# Patient Record
Sex: Female | Born: 1995 | Hispanic: No | Smoking: Current every day smoker
Health system: Southern US, Community
[De-identification: ages and names within clinical notes are randomized; demographics above are authoritative.]

## PROBLEM LIST (undated history)

## (undated) DIAGNOSIS — F419 Anxiety disorder, unspecified: Secondary | ICD-10-CM

## (undated) DIAGNOSIS — U071 COVID-19: Secondary | ICD-10-CM

## (undated) HISTORY — PX: NO PAST SURGERIES: SHX2092

## (undated) HISTORY — DX: COVID-19: U07.1

---

## 2018-12-27 DIAGNOSIS — F32A Depression, unspecified: Secondary | ICD-10-CM

## 2018-12-27 HISTORY — DX: Depression, unspecified: F32.A

## 2019-12-28 NOTE — L&D Delivery Note (Addendum)
Delivery Note Pt w/ant lip/+1/urge to push.  FHR Down to 60's w/slow return despite position changes. Lip reduced, pt effective pusher. Dr. Mayford Knife asked to come for vacuum. Risks/benefits of VAD discussed w/pt and partner--verbal consent obtained.  Indication for Vacuum: repetitive late decelerations, NRFHT.  NICU team called to attend delivery. Kiwi placed, 12 pulls with 12 pushes, no pop offs. Max pressure . Placed +1 station, OP presentation.  After a 10 minute 2nd stage, at 12:16 AM a viable female was delivered via Vaginal, Vacuum (Extractor) (Presentation: Left Occiput Anterior).  APGAR: 8/9 , ; weight pending.  After 1 minute, the cord was clamped and cut. Pitocin d/c'd per Dr. Vilinda Blanks request, repair of laceration done w/o problems. The placenta separated spontaneously and delivered via CCT and maternal pushing effort.  It was inspected and appears to be intact with a 3 VC. 40 units of pitocin diluted in 1000cc LR was infused rapidly IV.   Anesthesia: Epidural Episiotomy: None Lacerations: 2nd degree Suture Repair: 3.0 vicryl Est. Blood Loss (mL):  150  Mom to postpartum.  Baby to Couplet care / Skin to Skin.  Lynn Moses 12/19/2020, 12:26 AM

## 2020-01-28 DIAGNOSIS — N83209 Unspecified ovarian cyst, unspecified side: Secondary | ICD-10-CM

## 2020-01-28 HISTORY — DX: Unspecified ovarian cyst, unspecified side: N83.209

## 2020-05-13 DIAGNOSIS — F418 Other specified anxiety disorders: Secondary | ICD-10-CM

## 2020-05-13 HISTORY — DX: Other specified anxiety disorders: F41.8

## 2020-06-04 ENCOUNTER — Telehealth: Payer: Self-pay | Admitting: Family Medicine

## 2020-06-04 NOTE — Telephone Encounter (Signed)
Recv'd records from [facility] forwarded 25 pages to [facility]/Jacob DeCordova 6/9/21fbg

## 2020-06-12 ENCOUNTER — Ambulatory Visit (INDEPENDENT_AMBULATORY_CARE_PROVIDER_SITE_OTHER): Payer: No Typology Code available for payment source | Admitting: Family Medicine

## 2020-06-12 ENCOUNTER — Other Ambulatory Visit: Payer: Self-pay

## 2020-06-12 ENCOUNTER — Encounter: Payer: Self-pay | Admitting: Family Medicine

## 2020-06-12 DIAGNOSIS — Z3480 Encounter for supervision of other normal pregnancy, unspecified trimester: Secondary | ICD-10-CM

## 2020-06-12 DIAGNOSIS — Z8659 Personal history of other mental and behavioral disorders: Secondary | ICD-10-CM

## 2020-06-12 DIAGNOSIS — Z3481 Encounter for supervision of other normal pregnancy, first trimester: Secondary | ICD-10-CM

## 2020-06-12 DIAGNOSIS — Z3A13 13 weeks gestation of pregnancy: Secondary | ICD-10-CM

## 2020-06-12 NOTE — Progress Notes (Signed)
Subjective:  Lynn Moses is a G1P0 [redacted]w[redacted]d being seen today for her first obstetrical visit.  Her obstetrical history is significant for first pregnancy. This is a desired, but unplanned pregnancy. Patient does intend to breast feed. Pregnancy history fully reviewed.  Patient reports no complaints.  BP 101/65   Pulse 69   Ht 5\' 6"  (1.676 m)   Wt 139 lb (63 kg)   LMP 03/13/2020 (Exact Date)   BMI 22.44 kg/m   HISTORY: OB History  Gravida Para Term Preterm AB Living  1            SAB TAB Ectopic Multiple Live Births               # Outcome Date GA Lbr Len/2nd Weight Sex Delivery Anes PTL Lv  1 Current             Past Medical History:  Diagnosis Date  . Depression 2020   pt has stopped taking her Zoloft when she was trying to get pregnant    Past Surgical History:  Procedure Laterality Date  . NO PAST SURGERIES      Family History  Problem Relation Age of Onset  . Hypertension Maternal Grandmother   . Cancer Paternal Grandmother      Exam  BP 101/65   Pulse 69   Ht 5\' 6"  (1.676 m)   Wt 139 lb (63 kg)   LMP 03/13/2020 (Exact Date)   BMI 22.44 kg/m   Chaperone present during exam  CONSTITUTIONAL: Well-developed, well-nourished female in no acute distress.  HENT:  Normocephalic, atraumatic, External right and left ear normal. Oropharynx is clear and moist EYES: Conjunctivae and EOM are normal. Pupils are equal, round, and reactive to light. No scleral icterus.  NECK: Normal range of motion, supple, no masses.  Normal thyroid.  CARDIOVASCULAR: Normal heart rate noted, regular rhythm RESPIRATORY: Clear to auscultation bilaterally. Effort and breath sounds normal, no problems with respiration noted. BREASTS: Symmetric in size. No masses, skin changes, nipple drainage, or lymphadenopathy. ABDOMEN: Soft, normal bowel sounds, no distention noted.  No tenderness, rebound or guarding.  PELVIC: Normal appearing external genitalia; normal appearing vaginal mucosa and  cervix. No abnormal discharge noted. Normal uterine size, no other palpable masses, no uterine or adnexal tenderness. MUSCULOSKELETAL: Normal range of motion. No tenderness.  No cyanosis, clubbing, or edema.  2+ distal pulses. SKIN: Skin is warm and dry. No rash noted. Not diaphoretic. No erythema. No pallor. NEUROLOGIC: Alert and oriented to person, place, and time. Normal reflexes, muscle tone coordination. No cranial nerve deficit noted. PSYCHIATRIC: Normal mood and affect. Normal behavior. Normal judgment and thought content.    Assessment:    Pregnancy: G1P0 Patient Active Problem List   Diagnosis Date Noted  . Supervision of other normal pregnancy, antepartum 06/12/2020  . History of depression 06/12/2020      Plan:   1. Supervision of other normal pregnancy, antepartum FHT and FH normal. Reviewed PNL Desires Panorama. - Babyscripts Schedule Optimization - 06/14/2020 MFM OB COMP + 14 WK; Future - Genetic Screening  2. History of depression She stopped zoloft to get pregnant. She currently feels okay off antidepressant. Would like to meet with 06/14/2020 - patient referred. Discussed outcomes improved with psychosocial development of babies when mother's depression controlled on medication. - Ambulatory referral to Integrated Behavioral Health    Problem list reviewed and updated. 75% of 30 min visit spent on counseling and coordination of care.     [place]  06/12/2020 

## 2020-06-12 NOTE — Progress Notes (Signed)
Patient states she moved from [place] in May 2021. Patient states she did have some bleeding at the beginning of her pregnancy and was put on pelvic rest. Patient has had her first prenatal visit at [facility]. Patient is Conservation officer, nature at [employer]. Patient states that she stopped taking her Zoloft while trying to get pregnant.   Armandina Stammer RN

## 2020-06-17 NOTE — BH Specialist Note (Deleted)
Integrated Behavioral Health via Telemedicine Video (Caregility) Visit  06/17/2020 Clarabel Marion [id number]  Number of Integrated Behavioral Health visits: 1 Session Start time: 1:15***  Session End time: 2:15*** Total time: {IBH Total GBTD:17616073}  Referring Provider: Candelaria Celeste, DO Type of Visit: Video Patient/Family location: Home [facility] Provider location: [facility]  All persons participating in visit: Patient *** and [facility] Pualani Borah ***   Confirmed patient's address: Yes  Confirmed patient's phone number: Yes  Any changes to demographics: No   Confirmed patient's insurance: Yes  Any changes to patient's insurance: No   Discussed confidentiality: Yes ***  I connected with Loree Fee a video enabled telemedicine application (Caregility) and verified that I am speaking with the correct person using two identifiers.     I discussed the limitations of evaluation and management by telemedicine and the availability of in person appointments.  I discussed that the purpose of this visit is to provide behavioral health care while limiting exposure to the novel coronavirus.   Discussed there is a possibility of technology failure and discussed alternative modes of communication if that failure occurs.  I discussed that engaging in this virtual visit, they consent to the provision of behavioral healthcare and the services will be billed under their insurance.  Patient and/or legal guardian expressed understanding and consented to virtual visit: Yes   PRESENTING CONCERNS: Patient and/or family reports the following symptoms/concerns: *** Duration of problem: ***; Severity of problem: {Mild/Moderate/Severe:20260}  STRENGTHS (Protective Factors/Coping Skills): ***  GOALS ADDRESSED: Patient will: 1.  Reduce symptoms of: {IBH Symptoms:21014056}  2.  Increase knowledge and/or ability of: {IBH Patient Tools:21014057}   3.  Demonstrate ability to: {IBH Goals:21014053}  INTERVENTIONS: Interventions utilized:  {IBH Interventions:21014054} Standardized Assessments completed: {IBH Screening Tools:21014051}  ASSESSMENT: Patient currently experiencing ***.   Patient may benefit from psychoeducation and brief therapeutic interventions regarding coping with symptoms of *** .  PLAN: 1. Follow up with behavioral health clinician on : *** 2. Behavioral recommendations:  -*** -*** 3. Referral(s): {IBH Referrals:21014055}  I discussed the assessment and treatment plan with the patient and/or parent/guardian. They were provided an opportunity to ask questions and all were answered. They agreed with the plan and demonstrated an understanding of the instructions.   They were advised to call back or seek an in-person evaluation if the symptoms worsen or if the condition fails to improve as anticipated.  Valetta Close Zanae Kuehnle

## 2020-06-19 ENCOUNTER — Telehealth: Payer: Self-pay

## 2020-06-19 ENCOUNTER — Other Ambulatory Visit: Payer: Self-pay

## 2020-06-19 DIAGNOSIS — Z8659 Personal history of other mental and behavioral disorders: Secondary | ICD-10-CM

## 2020-06-19 NOTE — Telephone Encounter (Signed)
Patient states that she is still having some brown blood. Patient states that she has had some cramping.  Patient requesting a note for work that she can take frequent breaks and requests that I send it to my chart.    Patient also made aware that panorama results are low risk. Patient also made aware that she is having a baby boy. Armandina Stammer RN

## 2020-06-19 NOTE — Progress Notes (Signed)
Patient has history of depression and would like to speak with Marijean Niemann during pregnancy. Armandina Stammer RN

## 2020-06-24 NOTE — BH Specialist Note (Deleted)
Integrated Behavioral Health via Telemedicine Video (Caregility) Visit  06/24/2020 Lynn Moses [id number]  Number of Integrated Behavioral Health visits: 1 Session Start time: 1:45***  Session End time: 2:15*** Total time: {IBH Total PJAS:50539767}  Referring Provider: Candelaria Celeste, DO Type of Visit: Video Patient/Family location: Home [facility] Provider location: [facility]  All persons participating in visit: Patient *** and [facility] Lynn Moses ***   Confirmed patient's address: Yes  Confirmed patient's phone number: Yes  Any changes to demographics: No   Confirmed patient's insurance: Yes  Any changes to patient's insurance: No   Discussed confidentiality: Yes ***  I connected with Lynn Moses  by a video enabled telemedicine application (Caregility) and verified that I am speaking with the correct person using two identifiers.     I discussed the limitations of evaluation and management by telemedicine and the availability of in person appointments.  I discussed that the purpose of this visit is to provide behavioral health care while limiting exposure to the novel coronavirus.   Discussed there is a possibility of technology failure and discussed alternative modes of communication if that failure occurs.  I discussed that engaging in this virtual visit, they consent to the provision of behavioral healthcare and the services will be billed under their insurance.  Patient and/or legal guardian expressed understanding and consented to virtual visit: Yes   PRESENTING CONCERNS: Patient and/or family reports the following symptoms/concerns: *** Duration of problem: ***; Severity of problem: {Mild/Moderate/Severe:20260}  STRENGTHS (Protective Factors/Coping Skills): ***  GOALS ADDRESSED: Patient will: 1.  Reduce symptoms of: {IBH Symptoms:21014056}  2.  Increase knowledge and/or ability of: {IBH Patient Tools:21014057}   3.  Demonstrate ability to: {IBH Goals:21014053}  INTERVENTIONS: Interventions utilized:  {IBH Interventions:21014054} Standardized Assessments completed: {IBH Screening Tools:21014051}  ASSESSMENT: Patient currently experiencing ***.   Patient may benefit from psychoeducation and brief therapeutic interventions regarding coping with symptoms of *** .  PLAN: 1. Follow up with behavioral health clinician on : *** 2. Behavioral recommendations:  -*** -*** 3. Referral(s): {IBH Referrals:21014055}  I discussed the assessment and treatment plan with the patient and/or parent/guardian. They were provided an opportunity to ask questions and all were answered. They agreed with the plan and demonstrated an understanding of the instructions.   They were advised to call back or seek an in-person evaluation if the symptoms worsen or if the condition fails to improve as anticipated.  Lynn Moses

## 2020-07-09 NOTE — BH Specialist Note (Signed)
Integrated Behavioral Health via Telemedicine Video (Caregility) Visit  07/09/2020 Lynn Moses [id number]  Number of Integrated Behavioral Health visits: 1 Session Start time: 8:15  Session End time: 8:56 Total time: 27  Referring Provider: Candelaria Celeste, DO Type of Visit: Video Patient/Family location: Home [facility] Provider location: [facility]  All persons participating in visit: [name] and Lynn Moses Lynn Moses    Confirmed patient's address: Yes  Confirmed patient's phone number: Yes  Any changes to demographics: No   Confirmed patient's insurance: Yes  Any changes to patient's insurance: No   Discussed confidentiality: Yes   I connected with [name]  by a video enabled telemedicine application (Caregility) and verified that I am speaking with the correct person using two identifiers.     I discussed the limitations of evaluation and management by telemedicine and the availability of in person appointments.  I discussed that the purpose of this visit is to provide behavioral health care while limiting exposure to the novel coronavirus.   Discussed there is a possibility of technology failure and discussed alternative modes of communication if that failure occurs.  I discussed that engaging in this virtual visit, they consent to the provision of behavioral healthcare and the services will be billed under their insurance.  Patient and/or legal guardian expressed understanding and consented to virtual visit: Yes   PRESENTING CONCERNS: Patient and/or family reports the following symptoms/concerns: Pt states her primary goal is to decrease symptoms of depression and anxiety during pregnancy without medication; uses deep breathing and outdoor walks to cope currently.  Duration of problem: Ongoing; Severity of problem: moderately severe  STRENGTHS (Protective Factors/Coping Skills): Uses self-coping  strategies as needed; Good social support  GOALS ADDRESSED: Patient will: 1.  Reduce symptoms of: anxiety and depression  2.  Increase knowledge and/or ability of: healthy habits and self-management skills  3.  Demonstrate ability to: Increase healthy adjustment to current life circumstances and Increase motivation to adhere to plan of care  INTERVENTIONS: Interventions utilized:  Mindfulness or Management consultant and Psychoeducation and/or Health Education Standardized Assessments completed: GAD-7 and PHQ 9  ASSESSMENT: Patient currently experiencing Adjustment disorder with mixed anxiety and depression.   Patient may benefit from psychoeducation and brief therapeutic interventions regarding coping with symptoms of anxiety and depression .  PLAN: 1. Follow up with behavioral health clinician on : Two weeks 2. Behavioral recommendations:  -Continue taking prenatal vitamin daily -CALM relaxation breathing exercise twice daily (in morning w prenatal vitamin; at bedtime) -Continue to take daily walks outdoors, as able -Go to [email or web address] and view Virtual Tour of [facility] and consider registering for childbirth education class of choice -Read educational materials regarding coping with symptoms of anxiety with panic (on After Visit Summary) -Consider apps (on After Visit Summary) as additional self-care  3. Referral(s): Integrated [employer] (In Clinic)  I discussed the assessment and treatment plan with the patient and/or parent/guardian. They were provided an opportunity to ask questions and all were answered. They agreed with the plan and demonstrated an understanding of the instructions.   They were advised to call back or seek an in-person evaluation if the symptoms worsen or if the condition fails to improve as anticipated.  Valetta Close Denasia Venn  Depression screen [facility] 2/9 07/16/2020  Decreased Interest 2  Down, Depressed, Hopeless 1  PHQ - 2  Score 3  Altered sleeping 3  Tired, decreased energy 1  Change in appetite 0  Feeling bad or failure about yourself  2  Trouble concentrating 1  Moving slowly or fidgety/restless 0  Suicidal thoughts 0  PHQ-9 Score 10   GAD 7 : Generalized Anxiety Score 07/16/2020  Nervous, Anxious, on Edge 2  Control/stop worrying 3  Worry too much - different things 3  Trouble relaxing 2  Restless 1  Easily annoyed or irritable 2  Afraid - awful might happen 2  Total GAD 7 Score 15

## 2020-07-11 ENCOUNTER — Other Ambulatory Visit: Payer: Self-pay

## 2020-07-11 ENCOUNTER — Ambulatory Visit (INDEPENDENT_AMBULATORY_CARE_PROVIDER_SITE_OTHER): Payer: BLUE CROSS/BLUE SHIELD | Admitting: Obstetrics & Gynecology

## 2020-07-11 ENCOUNTER — Encounter: Payer: Self-pay | Admitting: Obstetrics & Gynecology

## 2020-07-11 VITALS — BP 110/60 | HR 70 | Wt 141.0 lb

## 2020-07-11 DIAGNOSIS — Z3480 Encounter for supervision of other normal pregnancy, unspecified trimester: Secondary | ICD-10-CM

## 2020-07-11 DIAGNOSIS — Z3402 Encounter for supervision of normal first pregnancy, second trimester: Secondary | ICD-10-CM

## 2020-07-11 DIAGNOSIS — Z3A17 17 weeks gestation of pregnancy: Secondary | ICD-10-CM

## 2020-07-11 DIAGNOSIS — Z8659 Personal history of other mental and behavioral disorders: Secondary | ICD-10-CM

## 2020-07-11 NOTE — Progress Notes (Signed)
   PRENATAL VISIT NOTE  Subjective:  Lynn Moses is a 24 y.o. G1P0 at [redacted]w[redacted]d being seen today for ongoing prenatal care.  She is currently monitored for the following issues for this low-risk pregnancy and has Supervision of other normal pregnancy, antepartum and History of depression on their problem list.  Patient reports no complaints.  Contractions: Not present. Vag. Bleeding: None.   . Denies leaking of fluid.   The following portions of the patient's history were reviewed and updated as appropriate: allergies, current medications, past family history, past medical history, past social history, past surgical history and problem list.   Objective:   Vitals:   07/11/20 0844  BP: 110/60  Pulse: 70  Weight: 141 lb (64 kg)    Fetal Status: Fetal Heart Rate (bpm): 148         General:  Alert, oriented and cooperative. Patient is in no acute distress.  Skin: Skin is warm and dry. No rash noted.   Cardiovascular: Normal heart rate noted  Respiratory: Normal respiratory effort, no problems with respiration noted  Abdomen: Soft, gravid, appropriate for gestational age.        Pelvic: Cervical exam deferred        Extremities: Normal range of motion.  Edema: None  Mental Status: Normal mood and affect. Normal behavior. Normal judgment and thought content.   Assessment and Plan:  Pregnancy: G1P0 at [redacted]w[redacted]d 1. Supervision of other normal pregnancy, antepartum - Babyscripts Schedule Optimization Labs: RPR, SMA AFP today Anatomy scan   2. History of depression stable  Preterm labor symptoms and general obstetric precautions including but not limited to vaginal bleeding, contractions, leaking of fluid and fetal movement were reviewed in detail with the patient. Please refer to After Visit Summary for other counseling recommendations.   Return in about 4 weeks (around 08/08/2020) for in person.  Future Appointments  Date Time Provider Department Center  07/16/2020  8:15 AM  [facility] HEALTH CLINICIAN [facility]  07/24/2020  9:45 AM [facility] US[facility]  08/08/2020  9:45 AM Willodean Rosenthal, MD [facility] None    Willodean Rosenthal, MD

## 2020-07-16 ENCOUNTER — Ambulatory Visit (INDEPENDENT_AMBULATORY_CARE_PROVIDER_SITE_OTHER): Payer: BLUE CROSS/BLUE SHIELD | Admitting: Clinical

## 2020-07-16 DIAGNOSIS — O99340 Other mental disorders complicating pregnancy, unspecified trimester: Secondary | ICD-10-CM

## 2020-07-16 DIAGNOSIS — F4323 Adjustment disorder with mixed anxiety and depressed mood: Secondary | ICD-10-CM | POA: Diagnosis not present

## 2020-07-16 NOTE — Patient Instructions (Signed)
Center for [facility] Healthcare at [facility] [address] [place], [place] [address] [phone] (main office) [phone] [facility] office)  [email or web address] (Virtual tour of [facility]; register for childbirth education classes)   Coping with Panic Attacks   What is a panic attack?  You may have had a panic attack if you experienced four or more of the symptoms listed below coming on abruptly and peaking in about 10 minutes.  Panic Symptoms    Pounding heart   Sweating   Trembling or shaking   Shortness of breath   Feeling of choking   Chest pain   Nausea or abdominal distress     Feeling dizzy, unsteady, lightheaded, or faint   Feelings of unreality or being detached from yourself   Fear of losing control or going crazy   Fear of dying   Numbness or tingling   Chills or hot flashes      Panic attacks are sometimes accompanied by avoidance of certain places or situations. These are often situations that would be difficult to escape from or in which help might not be available. Examples might include crowded shopping malls, public transportation, restaurants, or driving.   Why do panic attacks occur?   Panic attacks are the body's alarm system gone awry. All of [place] have a built-in alarm system, powered by adrenaline, which increases our heart rate, breathing, and blood flow in response to danger. Ordinarily, this 'danger response system' works well. In some people, however, the response is either out of proportion to whatever stress is going on, or may come out of the blue without any stress at all.   For example, if you are walking in the woods and see a bear coming your way, a variety of changes occur in your body to prepare you to either fight the danger or flee from the situation. Your heart rate will increase to get more blood flow around your body, your breathing rate will quicken so that more oxygen is available, and your muscles  will tighten in order to be ready to fight or run. You may feel nauseated as blood flow leaves your stomach area and moves into your limbs. These bodily changes are all essential to helping you survive the dangerous situation. After the danger has passed, your body functions will begin to go back to normal. This is because your body also has a system for "recovering" by bringing your body back down to a normal state when the danger is over.   As you can see, the emergency response system is adaptive when there is, in fact, a "true" or "real" danger (e.g., bear). However, sometimes people find that their emergency response system is triggered in "everyday" situations where there really is no true physical danger (e.g., in a meeting, in the grocery store, while driving in normal traffic, etc.).   What triggers a panic attack?  Sometimes particularly stressful situations can trigger a panic attack. For example, an argument with your spouse or stressors at work can cause a stress response (activating the emergency response system) because you perceive it as threatening or overwhelming, even if there is no direct risk to your survival.  Sometimes panic attacks don't seem to be triggered by anything in particular- they may "come out of the blue". Somehow, the natural "fight or flight" emergency response system has gotten activated when there is no real danger. Why does the body go into "emergency mode" when there is no real danger?   Often,  people with panic attacks are frightened or alarmed by the physical sensations of the emergency response system. First, unexpected physical sensations are experienced (tightness in your chest or some shortness of breath). This then leads to feeling fearful or alarmed by these symptoms ("Something's wrong!", "Am I having a heart attack?", "Am I going to faint?") The mind perceives that there is a danger even though no real danger exists. This, in turn, activates the emergency  response system ("fight or flight"), leading to a "full blown" panic attack. In summary, panic attacks occur when we misinterpret physical symptoms as signs of impending death, craziness, loss of control, embarrassment, or fear of fear. Sometimes you may be aware of thoughts of danger that activate the emergency response system (for example, thinking "I'm having a heart attack" when you feel chest pressure or increased heart rate). At other times, however, you may not be aware of such thoughts. After several incidences of being afraid of physical sensations, anxiety and panic can occur in response to the initial sensations without conscious thoughts of danger. Instead, you just feel afraid or alarmed. In other words, the panic or fear may seem to occur "automatically" without you consciously telling yourself anything.   After having had one or more panic attacks, you may also become more focused on what is going on inside your body. You may scan your body and be more vigilant about noticing any symptoms that might signal the start of a panic attack. This makes it easier for panic attacks to happen again because you pick up on sensations you might otherwise not have noticed, and misinterpret them as something dangerous. A panic attack may then result.      How do I cope with panic attacks?  An important part of overcoming panic attacks involves re-interpreting your body's physical reactions and teaching yourself ways to decrease the physical arousal. This can be done through practicing the cognitive and behavioral interventions below.   Research has found that over half of people who have panic attacks show some signs of hyperventilation or overbreathing. This can produce initial sensations that alarm you and lead to a panic attack. Overbreathing can also develop as part of the panic attack and make the symptoms worse. When people hyperventilate, certain blood vessels in the body become narrower. In  particular, the brain may get slightly less oxygen. This can lead to the symptoms of dizziness, confusion, and lightheadedness that often occur during panic attacks. Other parts of the body may also get a bit less oxygen, which may lead to numbness or tingling in the hands or feet or the sensation of cold, clammy hands. It also may lead the heart to pump harder. Although these symptoms may be frightening and feel unpleasant, it is important to remember that hyperventilating is not dangerous. However, you can help overcome the unpleasantness of overbreathing by practicing Breathing Retraining.   Practice this basic technique three times a day, every day:   Inhale. With your shoulders relaxed, inhale as slowly and deeply as you can while you count to six. If you can, use your diaphragm to fill your lungs with air.   Hold. Keep the air in your lungs as you slowly count to four.   Exhale. Slowly breath out as you count to six.   Repeat. Do the inhale-hold-exhale cycle several times. Each time you do it, exhale for longer counts.  Like any new skill, Breathing Retraining requires practice. Try practicing this skill twice a day for several  minutes. Initially, do not try this technique in specific situations or when you become frightened or have a panic attack. Begin by practicing in a quiet environment to build up your skill level so that you can later use it in time of "emergency."   2. Decreasing Avoidance  Regardless of whether you can identify why you began having panic attacks or whether they seemed to come out of the blue, the places where you began having panic attacks often can become triggers themselves. It is not uncommon for individuals to begin to avoid the places where they have had panic attacks. Over time, the individual may begin to avoid more and more places, thereby decreasing their activities and often negatively impacting their quality of life. To break the cycle of avoidance, it is  important to first identify the places or situations that are being avoided, and then to do some "relearning."  To begin this intervention, first create a list of locations or situations that you tend to avoid. Then choose an avoided location or situation that you would like to target first. Now develop an "exposure hierarchy" for this situation or location. An "exposure hierarchy" is a list of actions that make you feel anxious in this situation. Order these actions from least to most anxiety-producing. It is often helpful to have the first item on your hierarchy involve thinking or imagining part of the feared/avoided situation.   Here is an example of an exposure hierarchy for decreasing avoidance of the grocery store. Note how it is ordered from the least amount of anxiety (at the top) to the most anxiety (at the bottom):   Think about going to the grocery store alone.   Go to the grocery store with a friend or family member.   Go to the grocery store alone to pick up a few small items (5-10 minutes in the store).   Shopping for 10-20 minutes in the store alone.   Doing the shopping for the week by myself (20-30 minutes in the store).   Your homework is to "expose" yourself to the lowest item on your hierarchy and use your breathing relaxation and coping statements (see below) to help you remain in the situation. Practice this several times during the upcoming week. Once you have mastered each item with minimal anxiety, move on to the next higher action on your list.   Cognitive Interventions  1. Identify your negative self-talk Anxious thoughts can increase anxiety symptoms and panic. The first step in changing anxious thinking is to identify your own negative, alarming self-talk. Some common alarming thoughts:   I'm having a heart attack.             I must be going crazy.  I think I'm dying.  People will think I'm crazy.  I'm going to pass our.   Oh no- here it comes.   I  can't stand this.   I've got to get out of here!  2. Use positive coping statements Changing or disrupting a pattern of anxious thoughts by replacing them with more calming or supportive statements can help to divert a panic attack. Some common helpful coping statements:   This is not an emergency.   I don't like feeling this way, but I can accept it.   I can feel like this and still be okay.   This has happened before, and I was okay. I'll be okay this time, too.   I can be anxious and still deal with this  situation.       /Emotional Wellbeing Apps and Websites Here are a few free apps meant to help you to help yourself.  To find, try searching on the internet to see if the app is offered on Apple/Android devices. If your first choice doesn't come up on your device, the good news is that there are many choices! Play around with different apps to see which ones are helpful to you.    Calm This is an app meant to help increase calm feelings. Includes info, strategies, and tools for tracking your feelings.      Calm Harm  This app is meant to help with self-harm. Provides many 5-minute or 15-min coping strategies for doing instead of hurting yourself.       Healthy Minds Health Minds is a problem-solving tool to help deal with emotions and cope with stress you encounter wherever you are.      MindShift This app can help people cope with anxiety. Rather than trying to avoid anxiety, you can make an important shift and face it.      MY3  MY3 features a support system, safety plan and resources with the goal of offering a tool to use in a time of need.       My Life My Voice  This mood journal offers a simple solution for tracking your thoughts, feelings and moods. Animated emoticons can help identify your mood.       Relax Melodies Designed to help with sleep, on this app you can mix sounds and meditations for relaxation.      Smiling Mind Smiling Mind is  meditation made easy: it's a simple tool that helps put a smile on your mind.        Stop, Breathe & Think  A friendly, simple guide for people through meditations for mindfulness and compassion.  Stop, Breathe and Think Kids Enter your current feelings and choose a mission to help you cope. Offers videos for certain moods instead of just sound recordings.       Team Orange The goal of this tool is to help teens change how they think, act, and react. This app helps you focus on your own good feelings and experiences.      The [employer] Box The [employer] (VHB) contains simple tools to help patients with coping, relaxation, distraction, and positive thinking.

## 2020-07-21 LAB — AFP TETRA
DIA Mom Value: 0.6
DIA Value (EIA): 96.31 pg/mL
DSR (By Age)    1 IN: 1088
DSR (Second Trimester) 1 IN: 1306
Gestational Age: 17.1 WEEKS
MSAFP Mom: 1.28
MSAFP: 53 ng/mL
MSHCG Mom: 1.24
MSHCG: 41924 m[IU]/mL
Maternal Age At EDD: 23.5 yr
Osb Risk: 5096
T18 (By Age): 1:4238 {titer}
Test Results:: NEGATIVE
Weight: 141 [lb_av]
uE3 Mom: 0.48
uE3 Value: 0.58 ng/mL

## 2020-07-21 LAB — SMN1 COPY NUMBER ANALYSIS (SMA CARRIER SCREENING)

## 2020-07-21 LAB — RPR: RPR Ser Ql: NONREACTIVE

## 2020-07-24 ENCOUNTER — Other Ambulatory Visit: Payer: Self-pay

## 2020-07-24 ENCOUNTER — Other Ambulatory Visit: Payer: Self-pay | Admitting: *Deleted

## 2020-07-24 ENCOUNTER — Ambulatory Visit: Payer: BLUE CROSS/BLUE SHIELD | Attending: Family Medicine

## 2020-07-24 DIAGNOSIS — Z363 Encounter for antenatal screening for malformations: Secondary | ICD-10-CM

## 2020-07-24 DIAGNOSIS — Z3A19 19 weeks gestation of pregnancy: Secondary | ICD-10-CM | POA: Diagnosis not present

## 2020-07-24 DIAGNOSIS — O99340 Other mental disorders complicating pregnancy, unspecified trimester: Secondary | ICD-10-CM

## 2020-07-24 DIAGNOSIS — Z3480 Encounter for supervision of other normal pregnancy, unspecified trimester: Secondary | ICD-10-CM | POA: Diagnosis not present

## 2020-07-24 DIAGNOSIS — Z362 Encounter for other antenatal screening follow-up: Secondary | ICD-10-CM

## 2020-07-24 DIAGNOSIS — F3289 Other specified depressive episodes: Secondary | ICD-10-CM | POA: Diagnosis not present

## 2020-07-24 NOTE — BH Specialist Note (Signed)
Error; pt requests to reschedule due to scheduling conflict

## 2020-07-31 ENCOUNTER — Ambulatory Visit: Payer: BLUE CROSS/BLUE SHIELD | Admitting: Clinical

## 2020-07-31 ENCOUNTER — Other Ambulatory Visit: Payer: Self-pay

## 2020-08-05 NOTE — BH Specialist Note (Signed)
Integrated Behavioral Health via Telemedicine Video (Caregility) Visit  08/05/2020 Lynn Moses [id number]  Number of Integrated Behavioral Health visits: 2 Session Start time: 10:15  Session End time: 10:27 Total time: 15  Referring Provider: Candelaria Celeste, DO Type of Visit: Video Patient/Family location: Home [facility] Provider location: Center for [facility] Healthcare at [facility]  All persons participating in visit: [name] and [facility] Hulda Marin    Discussed confidentiality: At previous visit  I connected with Loree Fee a video enabled telemedicine application (Caregility) and verified that I am speaking with the correct person using two identifiers.    I discussed that engaging in this virtual visit, they consent to the provision of behavioral healthcare and the services will be billed under their insurance.   Patient and/or legal guardian expressed understanding and consented to virtual visit: Yes   PRESENTING CONCERNS: Patient and/or family reports the following symptoms/concerns: Pt states her primary concern today fatigue, that she attributes to current stage of pregnancy; looking forward to going back to school in one week, and using self-coping strategies daily to manage symptoms. Duration of problem: Current pregnancy ; Severity of problem: moderate  STRENGTHS (Protective Factors/Coping Skills): Good social support; utilizes daily self-coping strategies  GOALS ADDRESSED: Patient will: 1.  Reduce symptoms of: anxiety and depression  2.  Demonstrate ability to: Increase healthy adjustment to current life circumstances  INTERVENTIONS: Interventions utilized:  Supportive Counseling and Psychoeducation and/or Health Education Standardized Assessments completed: GAD-7 and PHQ 9  ASSESSMENT: Patient currently experiencing Adjustment disorder with mixed anxiety and depressed mood.   Patient may benefit from continued  psychoeducation and brief therapeutic interventions regarding coping with symptoms of anxiety and depression .  PLAN: 1. Follow up with behavioral health clinician on : As needed 2. Behavioral recommendations:  -Continue taking prenatal vitamin daily -Continue daily self-coping strategies (self-talk, breathing exercise, outdoor walks) -Continue plan to attend Virtual Tour of Hospital at [email or web address] and register for virtual childbirth education class 3. Referral(s): Integrated [employer] (In Clinic)  I discussed the assessment and treatment plan with the patient and/or parent/guardian. They were provided an opportunity to ask questions and all were answered. They agreed with the plan and demonstrated an understanding of the instructions.   They were advised to call back or seek an in-person evaluation if the symptoms worsen or if the condition fails to improve as anticipated.   Confirmed patient's address: Yes  Confirmed patient's phone number: Yes  Any changes to demographics: No   Confirmed patient's insurance: Yes  Any changes to patient's insurance: No   I discussed the limitations of evaluation and management by telemedicine and the availability of in person appointments.  I discussed that the purpose of this visit is to provide behavioral health care while limiting exposure to the novel coronavirus.   Discussed there is a possibility of technology failure and discussed alternative modes of communication if that failure occurs.  Valetta Close Montrell Cessna   Depression screen [facility] 2/9 08/07/2020 07/16/2020  Decreased Interest 2 2  Down, Depressed, Hopeless 1 1  PHQ - 2 Score 3 3  Altered sleeping 2 3  Tired, decreased energy 2 1  Change in appetite 0 0  Feeling bad or failure about yourself  0 2  Trouble concentrating 0 1  Moving slowly or fidgety/restless 0 0  Suicidal thoughts 0 0  PHQ-9 Score 7 10   GAD 7 : Generalized Anxiety Score 08/07/2020 07/16/2020   Nervous, Anxious, on Edge 1  2  Control/stop worrying 1 3  Worry too much - different things 1 3  Trouble relaxing 0 2  Restless 0 1  Easily annoyed or irritable 1 2  Afraid - awful might happen 1 2  Total GAD 7 Score 5 15

## 2020-08-07 ENCOUNTER — Encounter (HOSPITAL_COMMUNITY): Payer: Self-pay | Admitting: Obstetrics and Gynecology

## 2020-08-07 ENCOUNTER — Ambulatory Visit (INDEPENDENT_AMBULATORY_CARE_PROVIDER_SITE_OTHER): Payer: BLUE CROSS/BLUE SHIELD | Admitting: Clinical

## 2020-08-07 ENCOUNTER — Other Ambulatory Visit: Payer: Self-pay

## 2020-08-07 ENCOUNTER — Inpatient Hospital Stay (HOSPITAL_COMMUNITY)
Admission: AD | Admit: 2020-08-07 | Discharge: 2020-08-07 | Disposition: A | Discharge status: Home / Self Care | Payer: BLUE CROSS/BLUE SHIELD | Attending: Obstetrics and Gynecology | Admitting: Obstetrics and Gynecology

## 2020-08-07 ENCOUNTER — Inpatient Hospital Stay (HOSPITAL_COMMUNITY): Payer: BLUE CROSS/BLUE SHIELD

## 2020-08-07 ENCOUNTER — Ambulatory Visit (INDEPENDENT_AMBULATORY_CARE_PROVIDER_SITE_OTHER): Payer: BLUE CROSS/BLUE SHIELD | Admitting: Family Medicine

## 2020-08-07 ENCOUNTER — Encounter: Payer: Self-pay | Admitting: Family Medicine

## 2020-08-07 VITALS — BP 109/78 | HR 86 | Wt 145.0 lb

## 2020-08-07 DIAGNOSIS — F419 Anxiety disorder, unspecified: Secondary | ICD-10-CM | POA: Diagnosis not present

## 2020-08-07 DIAGNOSIS — Z3A21 21 weeks gestation of pregnancy: Secondary | ICD-10-CM

## 2020-08-07 DIAGNOSIS — O99342 Other mental disorders complicating pregnancy, second trimester: Secondary | ICD-10-CM | POA: Diagnosis not present

## 2020-08-07 DIAGNOSIS — O26892 Other specified pregnancy related conditions, second trimester: Secondary | ICD-10-CM | POA: Diagnosis not present

## 2020-08-07 DIAGNOSIS — R1031 Right lower quadrant pain: Secondary | ICD-10-CM

## 2020-08-07 DIAGNOSIS — Z3480 Encounter for supervision of other normal pregnancy, unspecified trimester: Secondary | ICD-10-CM

## 2020-08-07 DIAGNOSIS — O99340 Other mental disorders complicating pregnancy, unspecified trimester: Secondary | ICD-10-CM

## 2020-08-07 DIAGNOSIS — F4323 Adjustment disorder with mixed anxiety and depressed mood: Secondary | ICD-10-CM

## 2020-08-07 HISTORY — DX: Anxiety disorder, unspecified: F41.9

## 2020-08-07 LAB — URINALYSIS, ROUTINE W REFLEX MICROSCOPIC
Bilirubin Urine: NEGATIVE
Glucose, UA: NEGATIVE mg/dL
Hgb urine dipstick: NEGATIVE
Ketones, ur: NEGATIVE mg/dL
Leukocytes,Ua: NEGATIVE
Nitrite: NEGATIVE
Protein, ur: NEGATIVE mg/dL
Specific Gravity, Urine: 1.014 (ref 1.005–1.030)
pH: 8 (ref 5.0–8.0)

## 2020-08-07 LAB — COMPREHENSIVE METABOLIC PANEL
ALT: 14 U/L (ref 0–44)
AST: 14 U/L — ABNORMAL LOW (ref 15–41)
Albumin: 3.4 g/dL — ABNORMAL LOW (ref 3.5–5.0)
Alkaline Phosphatase: 37 U/L — ABNORMAL LOW (ref 38–126)
Anion gap: 9 (ref 5–15)
BUN: 11 mg/dL (ref 6–20)
CO2: 23 mmol/L (ref 22–32)
Calcium: 9.4 mg/dL (ref 8.9–10.3)
Chloride: 102 mmol/L (ref 98–111)
Creatinine, Ser: 0.65 mg/dL (ref 0.44–1.00)
GFR calc Af Amer: >60 mL/min (ref >60)
GFR calc non Af Amer: >60 mL/min (ref >60)
Glucose, Bld: 79 mg/dL (ref 70–99)
Potassium: 4 mmol/L (ref 3.5–5.1)
Sodium: 134 mmol/L — ABNORMAL LOW (ref 135–145)
Total Bilirubin: 0.4 mg/dL (ref 0.3–1.2)
Total Protein: 6.1 g/dL — ABNORMAL LOW (ref 6.5–8.1)

## 2020-08-07 LAB — CBC WITH DIFFERENTIAL/PLATELET
Abs Immature Granulocytes: 0.07 10*3/uL (ref 0.00–0.07)
Basophils Absolute: 0 10*3/uL (ref 0.0–0.1)
Basophils Relative: 0 %
Eosinophils Absolute: 0.1 10*3/uL (ref 0.0–0.5)
Eosinophils Relative: 1 %
HCT: 39.2 % (ref 36.0–46.0)
Hemoglobin: 12.8 g/dL (ref 12.0–15.0)
Immature Granulocytes: 1 %
Lymphocytes Relative: 13 %
Lymphs Abs: 1.5 10*3/uL (ref 0.7–4.0)
MCH: 28.3 pg (ref 26.0–34.0)
MCHC: 32.7 g/dL (ref 30.0–36.0)
MCV: 86.5 fL (ref 80.0–100.0)
Monocytes Absolute: 0.6 10*3/uL (ref 0.1–1.0)
Monocytes Relative: 5 %
Neutro Abs: 9.3 10*3/uL — ABNORMAL HIGH (ref 1.7–7.7)
Neutrophils Relative %: 80 %
Platelets: 200 10*3/uL (ref 150–400)
RBC: 4.53 MIL/uL (ref 3.87–5.11)
RDW: 13.3 % (ref 11.5–15.5)
WBC: 11.6 10*3/uL — ABNORMAL HIGH (ref 4.0–10.5)
nRBC: 0 % (ref 0.0–0.2)

## 2020-08-07 NOTE — Patient Instructions (Signed)
Center for [facility] Healthcare at [facility] for [facility] [address] [place], [place] [address] [phone] (main office) [phone] [facility] office)  [email or web address] (virtual tour and childbirth education classes)

## 2020-08-07 NOTE — Progress Notes (Signed)
Patient complaining pain on right side that radiates to her back and down her right thigh. Armandina Stammer RN

## 2020-08-07 NOTE — Progress Notes (Signed)
   PRENATAL VISIT NOTE  Subjective:  Lynn Moses is a 24 y.o. G1P0 at [redacted]w[redacted]d being seen today for ongoing prenatal care.  She is currently monitored for the following issues for this low-risk pregnancy and has Supervision of other normal pregnancy, antepartum and History of depression on their problem list.  Patient reports RLQ pain. Woke up this morning. Has been fairly constant. Worse with laying on right side, standing, walking, etc. Boyfriend had to help her get up. Pain improved some with sitting. Radiates into right flank. No fevers, chills. Has diminished appetite, but this is somewhat chronic.Marland Kitchen  Contractions: Not present. Vag. Bleeding: None.  Movement: Present. Denies leaking of fluid.   The following portions of the patient's history were reviewed and updated as appropriate: allergies, current medications, past family history, past medical history, past social history, past surgical history and problem list.   Objective:   Vitals:   08/07/20 1453  BP: 109/78  Pulse: 86  Weight: 145 lb (65.8 kg)    Fetal Status: Fetal Heart Rate (bpm): 145   Movement: Present     General:  Alert, oriented and cooperative. Patient is in no acute distress.  Skin: Skin is warm and dry. No rash noted.   Cardiovascular: Normal heart rate noted  Respiratory: Normal respiratory effort, no problems with respiration noted  Abdomen: Soft, gravid, appropriate for gestational age.  Pain/Pressure: Present Significant RLQ tenderness. No rebound. Mild guarding.     Pelvic: Cervical exam deferred        Extremities: Normal range of motion.  Edema: None  Mental Status: Normal mood and affect. Normal behavior. Normal judgment and thought content.   Assessment and Plan:  Pregnancy: G1P0 at [redacted]w[redacted]d  1. [redacted] weeks gestation of pregnancy  2. Supervision of other normal pregnancy, antepartum FHT and FH normal  3. RLQ abdominal pain Will send to [facility] to r/o appendicitis.   Preterm labor symptoms and general  obstetric precautions including but not limited to vaginal bleeding, contractions, leaking of fluid and fetal movement were reviewed in detail with the patient. Please refer to After Visit Summary for other counseling recommendations.   No follow-ups on file.  Future Appointments  Date Time Provider Department Center  08/22/2020  1:30 PM [facility] NURSE [facility]  08/22/2020  1:45 PM [facility]    Levie Heritage, DO

## 2020-08-07 NOTE — Discharge Instructions (Signed)
Abdominal Pain During Pregnancy  Abdominal pain is common during pregnancy, and has many possible causes. Some causes are more serious than others, and sometimes the cause is not known. Abdominal pain can be a sign that labor is starting. It can also be caused by normal growth and stretching of muscles and ligaments during pregnancy. Always tell your health care provider if you have any abdominal pain. Follow these instructions at home:  Do not have sex or put anything in your vagina until your pain goes away completely.  Get plenty of rest until your pain improves.  Drink enough fluid to keep your urine pale yellow.  Take over-the-counter and prescription medicines only as told by your health care provider.  Keep all follow-up visits as told by your health care provider. This is important. Contact a health care provider if:  Your pain continues or gets worse after resting.  You have lower abdominal pain that: ? Comes and goes at regular intervals. ? Spreads to your back. ? Is similar to menstrual cramps.  You have pain or burning when you urinate. Get help right away if:  You have a fever or chills.  You have vaginal bleeding.  You are leaking fluid from your vagina.  You are passing tissue from your vagina.  You have vomiting or diarrhea that lasts for more than 24 hours.  Your baby is moving less than usual.  You feel very weak or faint.  You have shortness of breath.  You develop severe pain in your upper abdomen. Summary  Abdominal pain is common during pregnancy, and has many possible causes.  If you experience abdominal pain during pregnancy, tell your health care provider right away.  Follow your health care provider's home care instructions and keep all follow-up visits as directed. This information is not intended to replace advice given to you by your health care provider. Make sure you discuss any questions you have with your health care  provider. Document Revised: 04/02/2019 Document Reviewed: 03/17/2017 Elsevier Patient Education  2020 [employer]   Appendicitis, Adult  The appendix is a tube in the body that is shaped like a finger. It is attached to the large intestine. Appendicitis means that this tube is swollen (inflamed). If this is not treated, the tube can tear (rupture). This can lead to a life-threatening infection. This condition can also cause pus to build up in the appendix (abscess). What are the causes? This condition may be caused by something that blocks the appendix. These include:  A ball of poop (stool).  Lymph glands that are bigger than normal. Sometimes the cause is not known. What increases the risk? You are more likely to develop this condition if you are between 43 and 59 years of age. What are the signs or symptoms? Symptoms of this condition include:  Pain around the belly button (navel). ? The pain moves toward the lower right belly (abdomen). ? The pain can get worse with time. ? The pain can get worse if you cough. ? The pain can get worse if you move suddenly.  Tenderness in the lower right belly.  Feeling sick to your stomach (nauseous).  Throwing up (vomiting).  Not feeling hungry (loss of appetite).  A fever.  Having trouble pooping (constipation).  Watery poop (diarrhea).  Not feeling well. How is this treated? Most often, this condition is treated by taking out the appendix (appendectomy). There are two ways to do this:  Open surgery. For this method, the appendix  is taken out through a large cut (incision). The cut is made in the lower right belly. This surgery may be used if: ? You have scars from another surgery. ? You have a bleeding condition. ? You are pregnant and will be having your baby soon. ? You have a condition that makes it hard to do the other type of surgery.  Laparoscopic surgery. For this method, the appendix is taken out through small cuts.  Often, this surgery: ? Causes less pain. ? Causes fewer problems. ? Is easier to heal from. If your appendix tears and pus forms:  A drain may be put into the sore. The drain will be used to get rid of the pus.  You may get an antibiotic medicine through an IV line.  Your appendix may or may not need to be taken out. Follow these instructions at home: If you had surgery, follow instructions from your doctor on how to care for yourself at home and how to take care of your cut from surgery. Medicines  Take over-the-counter and prescription medicines only as told by your doctor.  If you were prescribed an antibiotic medicine, take it as told by your doctor. Do not stop taking the antibiotic even if you start to feel better. Eating and drinking Follow instructions from your doctor about what you cannot eat or drink. You may go back to your diet slowly if:  You no longer feel sick to your stomach.  You have stopped throwing up. General instructions  Do not use any products that contain nicotine or tobacco, such as cigarettes, e-cigarettes, and chewing tobacco. If you need help quitting, ask your doctor.  Do not drive or use heavy machinery while taking prescription pain medicine.  Ask your doctor if the medicine you are taking can cause trouble pooping. You may need to take steps to prevent or treat trouble pooping: ? Drink enough fluid to keep your pee (urine) pale yellow. ? Take over-the-counter or prescription medicines. ? Eat foods that are high in fiber. These include beans, whole grains, and fresh fruits and vegetables. ? Limit foods that are high in fat and sugar. These include fried or sweet foods.  Keep all follow-up visits as told by your doctor. This is important. Contact a doctor if:  There is pus, blood, or a lot of fluid coming from your cut or cuts from surgery.  You are sick to your stomach or you throw up. Get help right away if:  You have pain in your belly,  and the pain is getting worse.  You have a fever.  You have chills.  You are very tired.  You have muscle pain.  You are short of breath. Summary  Appendicitis is swelling of the appendix. The appendix is a tube that is shaped like a finger. It is joined to the large intestine.  This condition may be caused by something that blocks the appendix. This can lead to an infection.  This condition is usually treated by taking out the appendix. This information is not intended to replace advice given to you by your health care provider. Make sure you discuss any questions you have with your health care provider. Document Revised: 05/31/2018 Document Reviewed: 05/31/2018 Elsevier Patient Education  2020 [employer].

## 2020-08-07 NOTE — MAU Note (Signed)
Woke up this morning with severe cramping when tried to roll over, abd around to back and down leg.  Has continued, when tries to stand or is standing for a long period.denies bleeding or loss of fluid.

## 2020-08-07 NOTE — MAU Provider Note (Addendum)
Chief Complaint: Abdominal Pain   First Provider Initiated Contact with Patient 08/07/20 1643     SUBJECTIVE HPI: Lynn Moses is a 24 y.o. G1P0 at [redacted]w[redacted]d who presents to Maternity Admissions reporting Severe RLQ pain since this morning.   Location: RLQ radiating around to right low back and occasionally down right thigh (although not now) Quality: sharp, cramping Severity: 9/10 on pain scale Duration: <24 hours Context: [redacted] weeks gestation Timing: constant Modifying factors: No improvement w/ warm compress. Worse when standing up straight and walking.  Associated signs and symptoms: POs for mild nausea and poor appetite throughout pregnancy. No noticeable change recently. Denies fever, chills, diarrhea, constipation, urinary complaints.   Ovaries and CL normal on anatomy US 2 weeks ago.   Past Medical History:  Diagnosis Date  . Anxiety   . Depression 2020   pt has stopped taking her Zoloft when she was trying to get pregnant  . Ovarian cyst 01/2020   OB History  Gravida Para Term Preterm AB Living  1            SAB TAB Ectopic Multiple Live Births               # Outcome Date GA Lbr Len/2nd Weight Sex Delivery Anes PTL Lv  1 Current            Past Surgical History:  Procedure Laterality Date  . NO PAST SURGERIES     Social History   Socioeconomic History  . Marital status: Single    Spouse name: Not on file  . Number of children: Not on file  . Years of education: 27  . Highest education level: Some college, no degree  Occupational History  . Not on file  Tobacco Use  . Smoking status: Never Smoker  . Smokeless tobacco: Never Used  Vaping Use  . Vaping Use: Never used  Substance and Sexual Activity  . Alcohol use: Not Currently  . Drug use: Never  . Sexual activity: Yes  Other Topics Concern  . Not on file  Social History Narrative  . Not on file   Social Determinants of Health   Financial Resource Strain:   . Difficulty of Paying Living Expenses:    Food Insecurity:   . Worried About Programme researcher, broadcasting/film/video in the Last Year:   . Barista in the Last Year:   Transportation Needs:   . Freight forwarder (Medical):   Marland Kitchen Lack of Transportation (Non-Medical):   Physical Activity:   . Days of Exercise per Week:   . Minutes of Exercise per Session:   Stress:   . Feeling of Stress :   Social Connections:   . Frequency of Communication with Friends and Family:   . Frequency of Social Gatherings with Friends and Family:   . Attends Religious Services:   . Active Member of Clubs or Organizations:   . Attends Banker Meetings:   Marland Kitchen Marital Status:   Intimate Partner Violence:   . Fear of Current or Ex-Partner:   . Emotionally Abused:   Marland Kitchen Physically Abused:   . Sexually Abused:    Family History  Problem Relation Age of Onset  . Hypertension Maternal Grandmother   . Cancer Paternal Grandmother   . Varicose Veins Mother    No current facility-administered medications on file prior to encounter.   Current Outpatient Medications on File Prior to Encounter  Medication Sig Dispense Refill  . prenatal vitamin w/FE, FA (  PRENATAL 1 + 1) 27-1 MG TABS tablet Take 1 tablet by mouth daily at 12 noon.     No Known Allergies  I have reviewed patient's Past Medical Hx, Surgical Hx, Family Hx, Social Hx, medications and allergies.   Review of Systems  Constitutional: Positive for fatigue. Negative for appetite change, chills and fever.  Gastrointestinal: Positive for abdominal pain and nausea. Negative for abdominal distention, constipation, diarrhea and vomiting.  Genitourinary: Negative for difficulty urinating, dysuria, flank pain, frequency, hematuria, urgency, vaginal bleeding and vaginal discharge.  Musculoskeletal: Positive for back pain. Negative for myalgias.    OBJECTIVE Patient Vitals for the past 24 hrs:  BP Temp Temp src Pulse Resp SpO2 Height Weight  08/07/20 2054 103/61 97.7 F (36.5 C) Axillary 72 17 --  -- --  08/07/20 1608 101/65 97.9 F (36.6 C) Axillary 76 16 100 % 5\' 6"  (1.676 m) 66.1 kg   Constitutional: Well-developed, well-nourished female in mild distress.  Cardiovascular: normal rate Respiratory: normal rate and effort.  GI: Abd soft, moderate TTP suprapubic area to RLQ and low back, gravid appropriate for gestational age. Pos BS x 4.  MS: Extremities nontender, no edema, normal ROM Neurologic: Alert and oriented x 4.  GU: Neg CVAT.  SPECULUM EXAM: NEFG, physiologic discharge, no blood noted, cervix closed and long. No adnexal masses or tenderness. No CMT.  FHR 150 by doppler.   LAB RESULTS Results for orders placed or performed during the hospital encounter of 08/07/20 (from the past 24 hour(s))  Urinalysis, Routine w reflex microscopic     Status: Abnormal   Collection Time: 08/07/20  4:28 PM  Result Value Ref Range   Color, Urine YELLOW YELLOW   APPearance CLOUDY (A) CLEAR   Specific Gravity, Urine 1.014 1.005 - 1.030   pH 8.0 5.0 - 8.0   Glucose, UA NEGATIVE NEGATIVE mg/dL   Hgb urine dipstick NEGATIVE NEGATIVE   Bilirubin Urine NEGATIVE NEGATIVE   Ketones, ur NEGATIVE NEGATIVE mg/dL   Protein, ur NEGATIVE NEGATIVE mg/dL   Nitrite NEGATIVE NEGATIVE   Leukocytes,Ua NEGATIVE NEGATIVE  CBC with Differential/Platelet     Status: Abnormal   Collection Time: 08/07/20  7:46 PM  Result Value Ref Range   WBC 11.6 (H) 4.0 - 10.5 K/uL   RBC 4.53 3.87 - 5.11 MIL/uL   Hemoglobin 12.8 12.0 - 15.0 g/dL   HCT 10/07/20 36 - 46 %   MCV 86.5 80.0 - 100.0 fL   MCH 28.3 26.0 - 34.0 pg   MCHC 32.7 30.0 - 36.0 g/dL   RDW 09.6 04.5 - 40.9 %   Platelets 200 150 - 400 K/uL   nRBC 0.0 0.0 - 0.2 %   Neutrophils Relative % 80 %   Neutro Abs 9.3 (H) 1.7 - 7.7 K/uL   Lymphocytes Relative 13 %   Lymphs Abs 1.5 0.7 - 4.0 K/uL   Monocytes Relative 5 %   Monocytes Absolute 0.6 0 - 1 K/uL   Eosinophils Relative 1 %   Eosinophils Absolute 0.1 0 - 0 K/uL   Basophils Relative 0 %   Basophils  Absolute 0.0 0 - 0 K/uL   Immature Granulocytes 1 %   Abs Immature Granulocytes 0.07 0.00 - 0.07 K/uL  Comprehensive metabolic panel     Status: Abnormal   Collection Time: 08/07/20  7:46 PM  Result Value Ref Range   Sodium 134 (L) 135 - 145 mmol/L   Potassium 4.0 3.5 - 5.1 mmol/L   Chloride 102 98 -  111 mmol/L   CO2 23 22 - 32 mmol/L   Glucose, Bld 79 70 - 99 mg/dL   BUN 11 6 - 20 mg/dL   Creatinine, Ser 3.240.65 0.44 - 1.00 mg/dL   Calcium 9.4 8.9 - 40.110.3 mg/dL   Total Protein 6.1 (L) 6.5 - 8.1 g/dL   Albumin 3.4 (L) 3.5 - 5.0 g/dL   AST 14 (L) 15 - 41 U/L   ALT 14 0 - 44 U/L   Alkaline Phosphatase 37 (L) 38 - 126 U/L   Total Bilirubin 0.4 0.3 - 1.2 mg/dL   GFR calc non Af Amer >60 >60 mL/min   GFR calc Af Amer >60 >60 mL/min   Anion gap 9 5 - 15    IMAGING MR PELVIS WO CONTRAST  Result Date: 08/07/2020 CLINICAL DATA:  24 year old female pregnant at 2221 weeks gestation. Right lower quadrant abdominal pain. EXAM: MRI ABDOMEN AND PELVIS WITHOUT CONTRAST TECHNIQUE: Multiplanar multisequence MR imaging of the abdomen and pelvis was performed. No intravenous contrast was administered. COMPARISON:  None. FINDINGS: Axial and coronal haste images suggest a gas containing, nondilated appendix arising from the cecum near series 4, image 14, continuing retrocecal (through series 11). Fluid sensitive fat saturated haste images demonstrate no regional inflammation (series 17, image 28). No right lower quadrant free fluid. Negative visible lung bases.  No osseous abnormality is identified. Trace pelvic free fluid. Gravid uterus. The ovaries are located along the anterior/lateral pelvic inlet. Major vascular flow voids are preserved. No lymphadenopathy identified. No dilated large or small bowel. Redundant transverse colon. Largely decompressed stomach. No abdominal free fluid identified. Liver, gallbladder, spleen, pancreas and adrenal glands are within normal limits. Mild left and moderate right  hydronephrosis, with asymmetric right hydroureter which appears compressed at the pelvic inlet posterior to the uterus (series 9, image 5). Both distal ureters appear decompressed. Decompressed urinary bladder. No filling defect evident within the right ureter. IMPRESSION: 1. No evidence of acute appendicitis. 2. Right greater than left maternal hydronephrosis of pregnancy. Electronically Signed   By: Odessa FlemingH  Hall M.D.   On: 08/07/2020 19:19   MR ABDOMEN WO CONTRAST  Result Date: 08/07/2020 CLINICAL DATA:  24 year old female pregnant at [redacted] weeks gestation. Right lower quadrant abdominal pain. EXAM: MRI ABDOMEN AND PELVIS WITHOUT CONTRAST TECHNIQUE: Multiplanar multisequence MR imaging of the abdomen and pelvis was performed. No intravenous contrast was administered. COMPARISON:  None. FINDINGS: Axial and coronal haste images suggest a gas containing, nondilated appendix arising from the cecum near series 4, image 14, continuing retrocecal (through series 11). Fluid sensitive fat saturated haste images demonstrate no regional inflammation (series 17, image 28). No right lower quadrant free fluid. Negative visible lung bases.  No osseous abnormality is identified. Trace pelvic free fluid. Gravid uterus. The ovaries are located along the anterior/lateral pelvic inlet. Major vascular flow voids are preserved. No lymphadenopathy identified. No dilated large or small bowel. Redundant transverse colon. Largely decompressed stomach. No abdominal free fluid identified. Liver, gallbladder, spleen, pancreas and adrenal glands are within normal limits. Mild left and moderate right hydronephrosis, with asymmetric right hydroureter which appears compressed at the pelvic inlet posterior to the uterus (series 9, image 5). Both distal ureters appear decompressed. Decompressed urinary bladder. No filling defect evident within the right ureter. IMPRESSION: 1. No evidence of acute appendicitis. 2. Right greater than left maternal  hydronephrosis of pregnancy. Electronically Signed   By: Odessa FlemingH  Hall M.D.   On: 08/07/2020 19:19   US MFM OB COMP + 14  WK  Result Date: 07/24/2020 ----------------------------------------------------------------------  OBSTETRICS REPORT                       (Signed Final 07/24/2020 10:53 am) ---------------------------------------------------------------------- Patient Info  ID #:       [id number]                          D.O.B.:  [birth year] (24 yrs)  Name:       Lynn Moses                  Visit Date: 07/24/2020 09:49 am ---------------------------------------------------------------------- Performed By  Attending:        Lin Landsman      Ref. Address:      [address]                    [address]                                                              [address]  Performed By:     Tommie Raymond BS,       [facility]                    RDMS, RVT                                 [facility]  Referred By:      [facility] ---------------------------------------------------------------------- Orders  #  Description                           Code        Ordered By  1  [place] MFM OB COMP + 14 WK                [id number]    JACOB STINSON ----------------------------------------------------------------------  #  Order #                     Accession #                Episode #  1  [id number]                   [id number]  [id number] ---------------------------------------------------------------------- Indications  [redacted] weeks gestation of pregnancy                 Z3A.19  Encounter for antenatal screening for           Z36.3  malformations  Other mental disorder complicating              O99.340  pregnancy, second trimester (Depression) ---------------------------------------------------------------------- Fetal Evaluation  Num Of Fetuses:           1  Fetal Heart              150  Rate(bpm):  Cardiac Activity:        Observed  Presentation:            Variable  Placenta:                Fundal Right  P. Cord Insertion:       Visualized, central  Amniotic Fluid  AFI FV:      Within normal limits                              Largest Pocket(cm)                              5.4 ---------------------------------------------------------------------- Biometry  BPD:      41.7  mm     G. Age:  18w 4d         34  %    CI:         77.91  %    70 - 86                                                          FL/HC:       18.3  %    16.1 - 18.3  HC:      149.5  mm     G. Age:  18w 0d          7  %    HC/AC:       1.07       1.09 - 1.39  AC:      140.1  mm     G. Age:  19w 3d         60  %    FL/BPD:      65.7  %  FL:       27.4  mm     G. Age:  18w 3d         22  %    FL/AC:       19.6  %    20 - 24  HUM:      25.6  mm     G. Age:  18w 0d         23  %  CER:      18.7  mm     G. Age:  18w 3d         12  %  NFT:       4.7  mm  LV:        6.6  mm  CM:        3.6  mm  Est. FW:     259   g      0 lb 9 oz     35  %                     m ---------------------------------------------------------------------- OB History  Blood Type:   A+  Gravidity:    1         Term:   0        Prem:   0         SAB:   0  TOP:          0       Ectopic:  0        Living: 0 ---------------------------------------------------------------------- Gestational Age  LMP:           19w 0d        Date:  03/13/20                 EDD:    12/18/20  U/S Today:     18w 4d                                        EDD:    12/21/20  Best:          19w 0d     Det. By:  LMP  (03/13/20)          EDD:    12/18/20 ---------------------------------------------------------------------- Anatomy  Cranium:               Appears normal         Aortic Arch:            Appears normal  Cavum:                 Appears normal         Ductal Arch:            Not well visualized  Ventricles:            Appears normal          Diaphragm:              Appears normal  Choroid Plexus:        Appears normal         Stomach:                Appears normal,                                                                        left sided  Cerebellum:            Appears normal         Abdomen:                Appears normal  Posterior Fossa:       Appears normal         Abdominal Wall:         Appears nml (cord  insert, abd wall)  Nuchal Fold:           Appears normal         Cord Vessels:           Appears normal (3                                                                        vessel cord)  Face:                  Orbits nl; profile not Kidneys:                Appear normal                         well visualized  Lips:                  Appears normal         Bladder:                Appears normal  Thoracic:              Appears normal         Spine:                  Appears normal  Heart:                 Appears normal         Upper Extremities:      Appears normal                         (4CH, axis, and                         situs)  RVOT:                  Appears normal         Lower Extremities:      Appears normal  LVOT:                  Appears normal  Other:  Fetus appears to be a female. Heels and 5th digit visualized. Open          hands visualized. Technically difficult due to fetal position. ---------------------------------------------------------------------- Cervix Uterus Adnexa  Cervix  Length:           3.45  cm.  Normal appearance by transabdominal scan.  Uterus  No abnormality visualized.  Right Ovary  Within normal limits. No adnexal mass visualized.  Left Ovary  Within normal limits. No adnexal mass visualized.  Cul De Sac  No free fluid seen.  Adnexa  No abnormality visualized. ---------------------------------------------------------------------- Impression  Single intrauterine pregnancy with measurements consistent  with dates  There is normal  anatomy however, suboptimal views of  some elements of the fetal anatomy were obtained  secondary to fetal position.  There is good fetal movement and amniotic fluid volume.  I discussed today's findings with Ms. Cavalieri and all questions  were answered. Given the suboptimal views we have  scheduled Ms. Fredman to return in 4 weeks. ---------------------------------------------------------------------- Recommendations  Follow up anatomy in 4 weeks. ----------------------------------------------------------------------               Lin Landsman, MD Electronically Signed Final Report   07/24/2020 10:53 am ----------------------------------------------------------------------   MAU COURSE Orders Placed This Encounter  Procedures  . MR PELVIS WO CONTRAST  . MR ABDOMEN WO CONTRAST  . Urinalysis, Routine w reflex microscopic  . CBC with Differential/Platelet  . Comprehensive metabolic panel  . Diet NPO time specified  . Discharge patient    MDM - Suprapubic and RLQ pain radiating to low back in pregnancy. No evidence of appendicitis, preterm labor or kidney stones/infection or other emergent condition. Uncertain etiology of pain--possible round ligament pain. Comfort measures, appendicitis and second trimester precautions reviewed.  ASSESSMENT 1. Abdominal pain during pregnancy, second trimester   2. RLQ abdominal pain     PLAN Discharge home in stable condition. Appendicitis and second trimester precautions ES Tylenol or IBU (not to exceed 48 hours or >28 weeks) PRN for pain.  Follow-up Information    [facility] Follow up.   Why: As scheduled or sooner as needed if symptoms worsen Contact information: [address] [place] [address]       [facility] Follow up.   Specialty: Obstetrics and Gynecology Why: as needed in pregnancy emergencies and iif symptoms worsen (Fever greater than 100.4, worsening abdominal  pain, vaginal bleeding, nausea and vomiting) Contact information: [address] [id number] mc [place] [address] [phone]             Allergies as of 08/07/2020   No Known Allergies     Medication List    TAKE these medications   prenatal vitamin w/FE, FA 27-1 MG Tabs tablet Take 1 tablet by mouth daily at 12 noon.        Katrinka Blazing, [place], [place] 08/07/2020  8:47 PM

## 2020-08-08 ENCOUNTER — Encounter: Payer: BLUE CROSS/BLUE SHIELD | Admitting: Obstetrics & Gynecology

## 2020-08-15 ENCOUNTER — Ambulatory Visit (INDEPENDENT_AMBULATORY_CARE_PROVIDER_SITE_OTHER): Payer: BLUE CROSS/BLUE SHIELD | Admitting: Family Medicine

## 2020-08-15 ENCOUNTER — Other Ambulatory Visit: Payer: Self-pay

## 2020-08-15 VITALS — BP 108/67 | HR 83 | Wt 150.0 lb

## 2020-08-15 DIAGNOSIS — Z3480 Encounter for supervision of other normal pregnancy, unspecified trimester: Secondary | ICD-10-CM

## 2020-08-15 DIAGNOSIS — Z3A22 22 weeks gestation of pregnancy: Secondary | ICD-10-CM

## 2020-08-15 DIAGNOSIS — Z8659 Personal history of other mental and behavioral disorders: Secondary | ICD-10-CM

## 2020-08-15 NOTE — Progress Notes (Signed)
° °  PRENATAL VISIT NOTE  Subjective:  Lynn Moses is a 24 y.o. G1P0 at [redacted]w[redacted]d being seen today for ongoing prenatal care.  She is currently monitored for the following issues for this low-risk pregnancy and has Supervision of other normal pregnancy, antepartum and History of depression on their problem list.  Patient reports no complaints.  Contractions: Not present. Vag. Bleeding: None.  Movement: Present. Denies leaking of fluid.   The following portions of the patient's history were reviewed and updated as appropriate: allergies, current medications, past family history, past medical history, past social history, past surgical history and problem list.   Objective:   Vitals:   08/15/20 0826  BP: 108/67  Pulse: 83  Weight: 150 lb (68 kg)    Fetal Status:     Movement: Present     General:  Alert, oriented and cooperative. Patient is in no acute distress.  Skin: Skin is warm and dry. No rash noted.   Cardiovascular: Normal heart rate noted  Respiratory: Normal respiratory effort, no problems with respiration noted  Abdomen: Soft, gravid, appropriate for gestational age.  Pain/Pressure: Present     Pelvic: Cervical exam deferred        Extremities: Normal range of motion.  Edema: None  Mental Status: Normal mood and affect. Normal behavior. Normal judgment and thought content.   Assessment and Plan:  Pregnancy: G1P0 at [redacted]w[redacted]d  1. Supervision of other normal pregnancy, antepartum FHT and FH normal. Abdominal pain improved  2. History of depression  3. [redacted] weeks gestation of pregnancy    Preterm labor symptoms and general obstetric precautions including but not limited to vaginal bleeding, contractions, leaking of fluid and fetal movement were reviewed in detail with the patient. Please refer to After Visit Summary for other counseling recommendations.   Return in about 4 weeks (around 09/12/2020) for OB f/u, 2 hr GTT.  Future Appointments  Date Time Provider Department  Center  08/22/2020  1:30 PM [facility] NURSE [facility] [place]  08/22/2020  1:45 PM [facility] US4 [facility]    Levie Heritage, DO

## 2020-08-22 ENCOUNTER — Ambulatory Visit (HOSPITAL_BASED_OUTPATIENT_CLINIC_OR_DEPARTMENT_OTHER): Payer: BLUE CROSS/BLUE SHIELD

## 2020-08-22 ENCOUNTER — Ambulatory Visit: Payer: BLUE CROSS/BLUE SHIELD | Attending: Obstetrics and Gynecology | Admitting: *Deleted

## 2020-08-22 ENCOUNTER — Ambulatory Visit: Payer: BLUE CROSS/BLUE SHIELD

## 2020-08-22 ENCOUNTER — Encounter: Payer: Self-pay | Admitting: *Deleted

## 2020-08-22 ENCOUNTER — Other Ambulatory Visit: Payer: Self-pay

## 2020-08-22 ENCOUNTER — Other Ambulatory Visit: Payer: Self-pay | Admitting: Maternal & Fetal Medicine

## 2020-08-22 DIAGNOSIS — Z362 Encounter for other antenatal screening follow-up: Secondary | ICD-10-CM

## 2020-08-22 DIAGNOSIS — F329 Major depressive disorder, single episode, unspecified: Secondary | ICD-10-CM | POA: Diagnosis not present

## 2020-08-22 DIAGNOSIS — Z3A23 23 weeks gestation of pregnancy: Secondary | ICD-10-CM

## 2020-08-22 DIAGNOSIS — O99340 Other mental disorders complicating pregnancy, unspecified trimester: Secondary | ICD-10-CM | POA: Diagnosis not present

## 2020-08-22 DIAGNOSIS — F99 Mental disorder, not otherwise specified: Secondary | ICD-10-CM | POA: Diagnosis not present

## 2020-08-22 DIAGNOSIS — O99342 Other mental disorders complicating pregnancy, second trimester: Secondary | ICD-10-CM | POA: Insufficient documentation

## 2020-08-22 DIAGNOSIS — Z3480 Encounter for supervision of other normal pregnancy, unspecified trimester: Secondary | ICD-10-CM

## 2020-08-22 DIAGNOSIS — Z8659 Personal history of other mental and behavioral disorders: Secondary | ICD-10-CM

## 2020-09-15 ENCOUNTER — Other Ambulatory Visit: Payer: BLUE CROSS/BLUE SHIELD

## 2020-09-15 ENCOUNTER — Other Ambulatory Visit: Payer: Self-pay

## 2020-09-15 ENCOUNTER — Ambulatory Visit (INDEPENDENT_AMBULATORY_CARE_PROVIDER_SITE_OTHER): Payer: BLUE CROSS/BLUE SHIELD | Admitting: Obstetrics & Gynecology

## 2020-09-15 VITALS — BP 107/71 | HR 79 | Wt 156.0 lb

## 2020-09-15 DIAGNOSIS — Z3A26 26 weeks gestation of pregnancy: Secondary | ICD-10-CM

## 2020-09-15 DIAGNOSIS — Z3402 Encounter for supervision of normal first pregnancy, second trimester: Secondary | ICD-10-CM

## 2020-09-15 NOTE — Patient Instructions (Signed)
Return to office for any scheduled appointments. Call the office or go to the MAU at [facility] at  if:  You begin to have strong, frequent contractions  Your water breaks.  Sometimes it is a big gush of fluid, sometimes it is just a trickle that keeps getting your panties wet or running down your legs  You have vaginal bleeding.  It is normal to have a small amount of spotting if your cervix was checked.   You do not feel your baby moving like normal.  If you do not, get something to eat and drink and lay down and focus on feeling your baby move.   If your baby is still not moving like normal, you should call the office or go to MAU.  Any other obstetric concerns.  TDaP Vaccine Pregnancy Get the Whooping Cough Vaccine While You Are Pregnant (CDC)  It is important for women to get the whooping cough vaccine in the third trimester of each pregnancy. Vaccines are the best way to prevent this disease. There are 2 different whooping cough vaccines. Both vaccines combine protection against whooping cough, tetanus and diphtheria, but they are for different age groups: Tdap: for everyone 11 years or older, including pregnant women  DTaP: for children 2 months through 6 years of age  You need the whooping cough vaccine during each of your pregnancies The recommended time to get the shot is during your 27th through 36th week of pregnancy, preferably during the earlier part of this time period. The Centers for Disease Control and Prevention (CDC) recommends that pregnant women receive the whooping cough vaccine for adolescents and adults (called Tdap vaccine) during the third trimester of each pregnancy. The recommended time to get the shot is during your 27th through 36th week of pregnancy, preferably during the earlier part of this time period. This replaces the original recommendation that pregnant women get the vaccine only if they had not previously received it. The  American College of Obstetricians and Gynecologists and the American College of Nurse-Midwives support this recommendation.  You should get the whooping cough vaccine while pregnant to pass protection to your baby frame support disabled and/or not supported in this browser  Learn why Laura decided to get the whooping cough vaccine in her 3rd trimester of pregnancy and how her baby girl was born with some protection against the disease. Also available on YouTube. After receiving the whooping cough vaccine, your body will create protective antibodies (proteins produced by the body to fight off diseases) and pass some of them to your baby before birth. These antibodies provide your baby some short-term protection against whooping cough in early life. These antibodies can also protect your baby from some of the more serious complications that come along with whooping cough. Your protective antibodies are at their highest about 2 weeks after getting the vaccine, but it takes time to pass them to your baby. So the preferred time to get the whooping cough vaccine is early in your third trimester. The amount of whooping cough antibodies in your body decreases over time. That is why CDC recommends you get a whooping cough vaccine during each pregnancy. Doing so allows each of your babies to get the greatest number of protective antibodies from you. This means each of your babies will get the best protection possible against this disease.  Getting the whooping cough vaccine while pregnant is better than getting the vaccine after you give birth Whooping cough vaccination during   pregnancy is ideal so your baby will have short-term protection as soon as he is born. This early protection is important because your baby will not start getting his whooping cough vaccines until he is 2 months old. These first few months of life are when your baby is at greatest risk for catching whooping cough. This is also when he's at  greatest risk for having severe, potentially life-threating complications from the infection. To avoid that gap in protection, it is best to get a whooping cough vaccine during pregnancy. You will then pass protection to your baby before he is born. To continue protecting your baby, he should get whooping cough vaccines starting at 2 months old. You may never have gotten the Tdap vaccine before and did not get it during this pregnancy. If so, you should make sure to get the vaccine immediately after you give birth, before leaving the hospital or birthing center. It will take about 2 weeks before your body develops protection (antibodies) in response to the vaccine. Once you have protection from the vaccine, you are less likely to give whooping cough to your newborn while caring for him. But remember, your baby will still be at risk for catching whooping cough from others. A recent study looked to see how effective Tdap was at preventing whooping cough in babies whose mothers got the vaccine while pregnant or in the hospital after giving birth. The study found that getting Tdap between 27 through 36 weeks of pregnancy is 85% more effective at preventing whooping cough in babies younger than 2 months old. Blood tests cannot tell if you need a whooping cough vaccine There are no blood tests that can tell you if you have enough antibodies in your body to protect yourself or your baby against whooping cough. Even if you have been sick with whooping cough in the past or previously received the vaccine, you still should get the vaccine during each pregnancy. Breastfeeding may pass some protective antibodies onto your baby By breastfeeding, you may pass some antibodies you have made in response to the vaccine to your baby. When you get a whooping cough vaccine during your pregnancy, you will have antibodies in your breast milk that you can share with your baby as soon as your milk comes in. However, your baby will not  get protective antibodies immediately if you wait to get the whooping cough vaccine until after delivering your baby. This is because it takes about 2 weeks for your body to create antibodies. Learn more about the health benefits of breastfeeding.  

## 2020-09-15 NOTE — Progress Notes (Signed)
   PRENATAL VISIT NOTE  Subjective:  Lynn Moses is a 24 y.o. G1P0 at [redacted]w[redacted]d being seen today for ongoing prenatal care.  She is currently monitored for the following issues for this low-risk pregnancy and has Supervision of other normal pregnancy, antepartum and History of depression on their problem list.  Patient reports no complaints.  Contractions: Not present. Vag. Bleeding: None.  Movement: Present. Denies leaking of fluid.   The following portions of the patient's history were reviewed and updated as appropriate: allergies, current medications, past family history, past medical history, past social history, past surgical history and problem list.   Objective:   Vitals:   09/15/20 0825  BP: 107/71  Pulse: 79  Weight: 156 lb 0.6 oz (70.8 kg)    Fetal Status: Fetal Heart Rate (bpm): 153 Fundal Height: 26 cm Movement: Present     General:  Alert, oriented and cooperative. Patient is in no acute distress.  Skin: Skin is warm and dry. No rash noted.   Cardiovascular: Normal heart rate noted  Respiratory: Normal respiratory effort, no problems with respiration noted  Abdomen: Soft, gravid, appropriate for gestational age.  Pain/Pressure: Present     Pelvic: Cervical exam deferred        Extremities: Normal range of motion.  Edema: None  Mental Status: Normal mood and affect. Normal behavior. Normal judgment and thought content.   Assessment and Plan:  Pregnancy: G1P0 at [redacted]w[redacted]d 1. [redacted] weeks gestation of pregnancy 2. Encounter for supervision of normal first pregnancy in second trimester Third trimester labs next visit.   Declined Tdap and Flu vaccines, also considering COVID vaccine. Preterm labor symptoms and general obstetric precautions including but not limited to vaginal bleeding, contractions, leaking of fluid and fetal movement were reviewed in detail with the patient. Please refer to After Visit Summary for other counseling recommendations.   Return in about 2 weeks  (around 09/29/2020) for 2 hr GTT, 3rd trimester labs, TDap, OFFICE OB Visit.  Future Appointments  Date Time Provider Department Center  09/15/2020 10:00 AM Tangie Stay, Jethro Bastos, MD [facility] None  10/03/2020  8:30 AM Levie Heritage, DO [facility] None    Jaynie Collins, MD

## 2020-10-03 ENCOUNTER — Other Ambulatory Visit: Payer: Self-pay

## 2020-10-03 ENCOUNTER — Ambulatory Visit (INDEPENDENT_AMBULATORY_CARE_PROVIDER_SITE_OTHER): Payer: BLUE CROSS/BLUE SHIELD | Admitting: Family Medicine

## 2020-10-03 VITALS — BP 104/64 | HR 78 | Wt 162.0 lb

## 2020-10-03 DIAGNOSIS — Z23 Encounter for immunization: Secondary | ICD-10-CM | POA: Diagnosis not present

## 2020-10-03 DIAGNOSIS — Z3A29 29 weeks gestation of pregnancy: Secondary | ICD-10-CM

## 2020-10-03 DIAGNOSIS — Z3480 Encounter for supervision of other normal pregnancy, unspecified trimester: Secondary | ICD-10-CM

## 2020-10-03 NOTE — Progress Notes (Signed)
   PRENATAL VISIT NOTE  Subjective:  Lynn Moses is a 24 y.o. G1P0 at [redacted]w[redacted]d being seen today for ongoing prenatal care.  She is currently monitored for the following issues for this low-risk pregnancy and has Supervision of other normal pregnancy, antepartum and History of depression on their problem list.  Patient reports no complaints.  Contractions: Not present. Vag. Bleeding: None.  Movement: Present. Denies leaking of fluid.   The following portions of the patient's history were reviewed and updated as appropriate: allergies, current medications, past family history, past medical history, past social history, past surgical history and problem list.   Objective:   Vitals:   10/03/20 0817  BP: 104/64  Pulse: 78  Weight: 162 lb (73.5 kg)    Fetal Status: Fetal Heart Rate (bpm): 130   Movement: Present     General:  Alert, oriented and cooperative. Patient is in no acute distress.  Skin: Skin is warm and dry. No rash noted.   Cardiovascular: Normal heart rate noted  Respiratory: Normal respiratory effort, no problems with respiration noted  Abdomen: Soft, gravid, appropriate for gestational age.  Pain/Pressure: Present     Pelvic: Cervical exam deferred        Extremities: Normal range of motion.  Edema: Trace  Mental Status: Normal mood and affect. Normal behavior. Normal judgment and thought content.   Assessment and Plan:  Pregnancy: G1P0 at [redacted]w[redacted]d 1. [redacted] weeks gestation of pregnancy FHT and FH normal - Glucose Tolerance, 2 Hours w/1 Hour - RPR - HIV Antibody (routine testing w rflx) - CBC - Tdap vaccine greater than or equal to 7yo IM - Flu Vaccine QUAD 36+ mos IM  Preterm labor symptoms and general obstetric precautions including but not limited to vaginal bleeding, contractions, leaking of fluid and fetal movement were reviewed in detail with the patient. Please refer to After Visit Summary for other counseling recommendations.   Return in about 2 weeks (around  10/17/2020) for OB f/u.  No future appointments.  Levie Heritage, DO

## 2020-10-04 LAB — CBC
Hematocrit: 37.2 % (ref 34.0–46.6)
Hemoglobin: 12.4 g/dL (ref 11.1–15.9)
MCH: 28.9 pg (ref 26.6–33.0)
MCHC: 33.3 g/dL (ref 31.5–35.7)
MCV: 87 fL (ref 79–97)
Platelets: 200 10*3/uL (ref 150–450)
RBC: 4.29 x10E6/uL (ref 3.77–5.28)
RDW: 12.7 % (ref 11.7–15.4)
WBC: 11.5 10*3/uL — ABNORMAL HIGH (ref 3.4–10.8)

## 2020-10-04 LAB — GLUCOSE TOLERANCE, 2 HOURS W/ 1HR
Glucose, 1 hour: 72 mg/dL (ref 65–179)
Glucose, 2 hour: 69 mg/dL (ref 65–152)
Glucose, Fasting: 73 mg/dL (ref 65–91)

## 2020-10-04 LAB — HIV ANTIBODY (ROUTINE TESTING W REFLEX): HIV Screen 4th Generation wRfx: NONREACTIVE

## 2020-10-04 LAB — RPR: RPR Ser Ql: NONREACTIVE

## 2020-10-17 ENCOUNTER — Other Ambulatory Visit: Payer: Self-pay

## 2020-10-17 ENCOUNTER — Encounter: Payer: Self-pay | Admitting: Obstetrics and Gynecology

## 2020-10-17 ENCOUNTER — Ambulatory Visit (INDEPENDENT_AMBULATORY_CARE_PROVIDER_SITE_OTHER): Payer: BLUE CROSS/BLUE SHIELD | Admitting: Obstetrics and Gynecology

## 2020-10-17 VITALS — BP 105/56 | HR 75 | Wt 169.0 lb

## 2020-10-17 DIAGNOSIS — Z3480 Encounter for supervision of other normal pregnancy, unspecified trimester: Secondary | ICD-10-CM

## 2020-10-17 DIAGNOSIS — Z3A31 31 weeks gestation of pregnancy: Secondary | ICD-10-CM

## 2020-10-17 NOTE — Progress Notes (Signed)
   PRENATAL VISIT NOTE  Subjective:  Lynn Moses is a 24 y.o. G1P0 at [redacted]w[redacted]d being seen today for ongoing prenatal care.  She is currently monitored for the following issues for this low-risk pregnancy and has Supervision of other normal pregnancy, antepartum and History of depression on their problem list.  Patient reports occasional tightening.  Contractions: Irregular. Vag. Bleeding: None.  Movement: Present. Denies leaking of fluid.   The following portions of the patient's history were reviewed and updated as appropriate: allergies, current medications, past family history, past medical history, past social history, past surgical history and problem list.   Objective:   Vitals:   10/17/20 0819  BP: (!) 105/56  Pulse: 75  Weight: 169 lb (76.7 kg)    Fetal Status: Fetal Heart Rate (bpm): 146 Fundal Height: 31 cm Movement: Present     General:  Alert, oriented and cooperative. Patient is in no acute distress.  Skin: Skin is warm and dry. No rash noted.   Cardiovascular: Normal heart rate noted  Respiratory: Normal respiratory effort, no problems with respiration noted  Abdomen: Soft, gravid, appropriate for gestational age.  Pain/Pressure: Present     Pelvic: Cervical exam deferred        Extremities: Normal range of motion.  Edema: Trace  Mental Status: Normal mood and affect. Normal behavior. Normal judgment and thought content.   Assessment and Plan:  Pregnancy: G1P0 at [redacted]w[redacted]d  1. [redacted] weeks gestation of pregnancy Doing well  2. Supervision of other normal pregnancy, antepartum Reviewed delivery plans, induction, hospital policies She has taken childbirth class and signed up for breastfeeding class   Preterm labor symptoms and general obstetric precautions including but not limited to vaginal bleeding, contractions, leaking of fluid and fetal movement were reviewed in detail with the patient. Please refer to After Visit Summary for other counseling recommendations.    Return in about 2 weeks (around 10/31/2020) for in person, low OB.  Future Appointments  Date Time Provider Department Center  10/30/2020  8:45 AM Levie Heritage, DO [facility] None    Conan Bowens, MD

## 2020-10-30 ENCOUNTER — Encounter: Payer: BLUE CROSS/BLUE SHIELD | Admitting: Family Medicine

## 2020-10-31 ENCOUNTER — Ambulatory Visit (INDEPENDENT_AMBULATORY_CARE_PROVIDER_SITE_OTHER): Payer: BLUE CROSS/BLUE SHIELD | Admitting: Family Medicine

## 2020-10-31 ENCOUNTER — Other Ambulatory Visit: Payer: Self-pay

## 2020-10-31 VITALS — BP 110/63 | HR 79 | Wt 173.0 lb

## 2020-10-31 DIAGNOSIS — Z8659 Personal history of other mental and behavioral disorders: Secondary | ICD-10-CM

## 2020-10-31 DIAGNOSIS — Z3480 Encounter for supervision of other normal pregnancy, unspecified trimester: Secondary | ICD-10-CM

## 2020-10-31 DIAGNOSIS — Z3A33 33 weeks gestation of pregnancy: Secondary | ICD-10-CM

## 2020-10-31 NOTE — Progress Notes (Signed)
   PRENATAL VISIT NOTE  Subjective:  Lynn Moses is a 24 y.o. G1P0 at [redacted]w[redacted]d being seen today for ongoing prenatal care.  She is currently monitored for the following issues for this low-risk pregnancy and has Supervision of other normal pregnancy, antepartum and History of depression on their problem list.  Patient reports no complaints.  Contractions: Not present. Vag. Bleeding: None.  Movement: Present. Denies leaking of fluid.   The following portions of the patient's history were reviewed and updated as appropriate: allergies, current medications, past family history, past medical history, past social history, past surgical history and problem list.   Objective:   Vitals:   10/31/20 1000  BP: 110/63  Pulse: 79  Weight: 173 lb (78.5 kg)    Fetal Status: Fetal Heart Rate (bpm): 130 Fundal Height: 33 cm Movement: Present     General:  Alert, oriented and cooperative. Patient is in no acute distress.  Skin: Skin is warm and dry. No rash noted.   Cardiovascular: Normal heart rate noted  Respiratory: Normal respiratory effort, no problems with respiration noted  Abdomen: Soft, gravid, appropriate for gestational age.  Pain/Pressure: Present     Pelvic: Cervical exam deferred        Extremities: Normal range of motion.  Edema: Trace  Mental Status: Normal mood and affect. Normal behavior. Normal judgment and thought content.   Assessment and Plan:  Pregnancy: G1P0 at [redacted]w[redacted]d 1. Supervision of other normal pregnancy, antepartum FHT and FH normal  2. History of depression  3. [redacted] weeks gestation of pregnancy  Preterm labor symptoms and general obstetric precautions including but not limited to vaginal bleeding, contractions, leaking of fluid and fetal movement were reviewed in detail with the patient. Please refer to After Visit Summary for other counseling recommendations.   Return in about 2 weeks (around 11/14/2020) for OB f/u.  Future Appointments  Date Time Provider  Department Center  11/10/2020  9:45 AM Adam Phenix, MD [facility] None    Levie Heritage, DO

## 2020-11-10 ENCOUNTER — Other Ambulatory Visit: Payer: Self-pay

## 2020-11-10 ENCOUNTER — Ambulatory Visit (INDEPENDENT_AMBULATORY_CARE_PROVIDER_SITE_OTHER): Payer: BLUE CROSS/BLUE SHIELD | Admitting: Obstetrics & Gynecology

## 2020-11-10 DIAGNOSIS — Z8659 Personal history of other mental and behavioral disorders: Secondary | ICD-10-CM

## 2020-11-10 DIAGNOSIS — Z3480 Encounter for supervision of other normal pregnancy, unspecified trimester: Secondary | ICD-10-CM

## 2020-11-10 NOTE — Patient Instructions (Signed)

## 2020-11-10 NOTE — Progress Notes (Signed)
   PRENATAL VISIT NOTE  Subjective:  Lynn Moses is a 24 y.o. G1P0 at [redacted]w[redacted]d being seen today for ongoing prenatal care.  She is currently monitored for the following issues for this low-risk pregnancy and has Supervision of other normal pregnancy, antepartum and History of depression on their problem list.  Patient reports no complaints.  Contractions: Not present. Vag. Bleeding: None.  Movement: Present. Denies leaking of fluid.   The following portions of the patient's history were reviewed and updated as appropriate: allergies, current medications, past family history, past medical history, past social history, past surgical history and problem list.   Objective:   Vitals:   11/10/20 0944  BP: 113/69  Pulse: 90  Weight: 174 lb (78.9 kg)    Fetal Status: Fetal Heart Rate (bpm): 135   Movement: Present     General:  Alert, oriented and cooperative. Patient is in no acute distress.  Skin: Skin is warm and dry. No rash noted.   Cardiovascular: Normal heart rate noted  Respiratory: Normal respiratory effort, no problems with respiration noted  Abdomen: Soft, gravid, appropriate for gestational age.  Pain/Pressure: Present     Pelvic: Cervical exam deferred        Extremities: Normal range of motion.  Edema: Trace  Mental Status: Normal mood and affect. Normal behavior. Normal judgment and thought content.   Assessment and Plan:  Pregnancy: G1P0 at [redacted]w[redacted]d 1. Supervision of other normal pregnancy, antepartum Doing well  2. History of depression Appears in remission  Preterm labor symptoms and general obstetric precautions including but not limited to vaginal bleeding, contractions, leaking of fluid and fetal movement were reviewed in detail with the patient. Please refer to After Visit Summary for other counseling recommendations.   Return in about 2 weeks (around 11/24/2020).  No future appointments.  Scheryl Darter, MD

## 2020-11-24 ENCOUNTER — Ambulatory Visit (INDEPENDENT_AMBULATORY_CARE_PROVIDER_SITE_OTHER): Payer: BLUE CROSS/BLUE SHIELD | Admitting: Obstetrics & Gynecology

## 2020-11-24 ENCOUNTER — Encounter: Payer: Self-pay | Admitting: Obstetrics & Gynecology

## 2020-11-24 ENCOUNTER — Other Ambulatory Visit (HOSPITAL_COMMUNITY)
Admission: RE | Admit: 2020-11-24 | Discharge: 2020-11-24 | Disposition: A | Discharge status: Home / Self Care | Payer: BLUE CROSS/BLUE SHIELD | Source: Ambulatory Visit | Attending: Obstetrics & Gynecology | Admitting: Obstetrics & Gynecology

## 2020-11-24 ENCOUNTER — Other Ambulatory Visit: Payer: Self-pay

## 2020-11-24 VITALS — BP 113/69 | HR 77 | Wt 179.0 lb

## 2020-11-24 DIAGNOSIS — Z3480 Encounter for supervision of other normal pregnancy, unspecified trimester: Secondary | ICD-10-CM | POA: Insufficient documentation

## 2020-11-24 DIAGNOSIS — Z3A36 36 weeks gestation of pregnancy: Secondary | ICD-10-CM

## 2020-11-24 NOTE — Patient Instructions (Signed)
Return to office for any scheduled appointments. Call the office or go to the MAU at [facility] at [place] if:  You begin to have strong, frequent contractions  Your water breaks.  Sometimes it is a big gush of fluid, sometimes it is just a trickle that keeps getting your panties wet or running down your legs  You have vaginal bleeding.  It is normal to have a small amount of spotting if your cervix was checked.   You do not feel your baby moving like normal.  If you do not, get something to eat and drink and lay down and focus on feeling your baby move.   If your baby is still not moving like normal, you should call the office or go to MAU.  Any other obstetric concerns.   

## 2020-11-24 NOTE — Progress Notes (Signed)
   PRENATAL VISIT NOTE  Subjective:  Lynn Moses is a 24 y.o. G1P0 at [redacted]w[redacted]d being seen today for ongoing prenatal care.  She is currently monitored for the following issues for this low-risk pregnancy and has Supervision of other normal pregnancy, antepartum and History of depression on their problem list.  Patient reports no complaints.  Contractions: Irregular. Vag. Bleeding: None.  Movement: Present. Denies leaking of fluid.   The following portions of the patient's history were reviewed and updated as appropriate: allergies, current medications, past family history, past medical history, past social history, past surgical history and problem list.   Objective:   Vitals:   11/24/20 0812  BP: 113/69  Pulse: 77  Weight: 179 lb (81.2 kg)    Fetal Status: Fetal Heart Rate (bpm): 138 Fundal Height: 36 cm Movement: Present  Presentation: Vertex  General:  Alert, oriented and cooperative. Patient is in no acute distress.  Skin: Skin is warm and dry. No rash noted.   Cardiovascular: Normal heart rate noted  Respiratory: Normal respiratory effort, no problems with respiration noted  Abdomen: Soft, gravid, appropriate for gestational age.  Pain/Pressure: Present     Pelvic: Cervical exam performed in the presence of a chaperone Dilation: Fingertip Effacement (%): 40 Station: -3  Extremities: Normal range of motion.  Edema: None  Mental Status: Normal mood and affect. Normal behavior. Normal judgment and thought content.   Assessment and Plan:  Pregnancy: G1P0 at [redacted]w[redacted]d 1. [redacted] weeks gestation of pregnancy 2. Supervision of other normal pregnancy, antepartum No complaints. Pelvic cultures done today. - GC/Chlamydia probe amp ([place])not at [facility] - Strep Gp B NAA Preterm labor symptoms and general obstetric precautions including but not limited to vaginal bleeding, contractions, leaking of fluid and fetal movement were reviewed in detail with the patient. Please refer to After Visit  Summary for other counseling recommendations.   Return in about 1 week (around 12/01/2020) for OFFICE OB VISIT (MD or APP).  No future appointments.  Jaynie Collins, MD

## 2020-11-25 LAB — GC/CHLAMYDIA PROBE AMP (~~LOC~~) NOT AT ARMC
Chlamydia: NEGATIVE
Comment: NEGATIVE
Comment: NORMAL
Neisseria Gonorrhea: NEGATIVE

## 2020-11-26 LAB — STREP GP B NAA: Strep Gp B NAA: NEGATIVE

## 2020-12-04 ENCOUNTER — Other Ambulatory Visit: Payer: Self-pay

## 2020-12-04 ENCOUNTER — Ambulatory Visit (INDEPENDENT_AMBULATORY_CARE_PROVIDER_SITE_OTHER): Payer: BLUE CROSS/BLUE SHIELD | Admitting: Family Medicine

## 2020-12-04 ENCOUNTER — Encounter: Payer: Self-pay | Admitting: Family Medicine

## 2020-12-04 VITALS — BP 123/66 | HR 76 | Wt 183.0 lb

## 2020-12-04 DIAGNOSIS — Z3480 Encounter for supervision of other normal pregnancy, unspecified trimester: Secondary | ICD-10-CM

## 2020-12-04 DIAGNOSIS — Z3A38 38 weeks gestation of pregnancy: Secondary | ICD-10-CM

## 2020-12-04 NOTE — Progress Notes (Signed)
   PRENATAL VISIT NOTE  Subjective:  Lynn Moses is a 24 y.o. G1P0 at [redacted]w[redacted]d being seen today for ongoing prenatal care.  She is currently monitored for the following issues for this low-risk pregnancy and has Supervision of other normal pregnancy, antepartum and History of depression on their problem list.  Patient reports occasional contractions.  Contractions: Irritability. Vag. Bleeding: None.  Movement: Present. Denies leaking of fluid.   The following portions of the patient's history were reviewed and updated as appropriate: allergies, current medications, past family history, past medical history, past social history, past surgical history and problem list.   Objective:   Vitals:   12/04/20 1403  BP: 123/66  Pulse: 76  Weight: 183 lb (83 kg)    Fetal Status: Fetal Heart Rate (bpm): 135 Fundal Height: 38 cm Movement: Present  Presentation: Vertex  General:  Alert, oriented and cooperative. Patient is in no acute distress.  Skin: Skin is warm and dry. No rash noted.   Cardiovascular: Normal heart rate noted  Respiratory: Normal respiratory effort, no problems with respiration noted  Abdomen: Soft, gravid, appropriate for gestational age.  Pain/Pressure: Present     Pelvic: Cervical exam performed in the presence of a chaperone Dilation: 2 Effacement (%): 60 Station: -3  Extremities: Normal range of motion.  Edema: None  Mental Status: Normal mood and affect. Normal behavior. Normal judgment and thought content.   Assessment and Plan:  Pregnancy: G1P0 at [redacted]w[redacted]d 1. Supervision of other normal pregnancy, antepartum FHT and FH normal  2. [redacted] weeks gestation of pregnancy   Term labor symptoms and general obstetric precautions including but not limited to vaginal bleeding, contractions, leaking of fluid and fetal movement were reviewed in detail with the patient. Please refer to After Visit Summary for other counseling recommendations.   Return in about 1 week (around  12/11/2020).  No future appointments.  Levie Heritage, DO

## 2020-12-10 ENCOUNTER — Ambulatory Visit (INDEPENDENT_AMBULATORY_CARE_PROVIDER_SITE_OTHER): Payer: BLUE CROSS/BLUE SHIELD | Admitting: Family Medicine

## 2020-12-10 ENCOUNTER — Other Ambulatory Visit: Payer: Self-pay

## 2020-12-10 VITALS — BP 108/74 | HR 87 | Wt 184.0 lb

## 2020-12-10 DIAGNOSIS — Z3480 Encounter for supervision of other normal pregnancy, unspecified trimester: Secondary | ICD-10-CM

## 2020-12-10 DIAGNOSIS — Z3A38 38 weeks gestation of pregnancy: Secondary | ICD-10-CM

## 2020-12-10 NOTE — Progress Notes (Signed)
   PRENATAL VISIT NOTE  Subjective:  Lynn Moses is a 24 y.o. G1P0 at [redacted]w[redacted]d being seen today for ongoing prenatal care.  She is currently monitored for the following issues for this low-risk pregnancy and has Supervision of other normal pregnancy, antepartum and History of depression on their problem list.  Patient reports occasional contractions.  Contractions: Irregular. Vag. Bleeding: None.  Movement: Present. Denies leaking of fluid.   The following portions of the patient's history were reviewed and updated as appropriate: allergies, current medications, past family history, past medical history, past social history, past surgical history and problem list.   Objective:   Vitals:   12/10/20 1408  BP: 108/74  Pulse: 87  Weight: 184 lb (83.5 kg)    Fetal Status: Fetal Heart Rate (bpm): 137   Movement: Present     General:  Alert, oriented and cooperative. Patient is in no acute distress.  Skin: Skin is warm and dry. No rash noted.   Cardiovascular: Normal heart rate noted  Respiratory: Normal respiratory effort, no problems with respiration noted  Abdomen: Soft, gravid, appropriate for gestational age.  Pain/Pressure: Present     Pelvic: Cervical exam deferred        Extremities: Normal range of motion.  Edema: None  Mental Status: Normal mood and affect. Normal behavior. Normal judgment and thought content.   Assessment and Plan:  Pregnancy: G1P0 at [redacted]w[redacted]d 1. [redacted] weeks gestation of pregnancy  2. Supervision of other normal pregnancy, antepartum FHT and FH normal  Term labor symptoms and general obstetric precautions including but not limited to vaginal bleeding, contractions, leaking of fluid and fetal movement were reviewed in detail with the patient. Please refer to After Visit Summary for other counseling recommendations.   Return in about 1 week (around 12/17/2020).  Future Appointments  Date Time Provider Department Center  12/17/2020  3:45 PM Levie Heritage, DO  [facility] None    Levie Heritage, DO

## 2020-12-17 ENCOUNTER — Other Ambulatory Visit: Payer: Self-pay

## 2020-12-17 ENCOUNTER — Ambulatory Visit (INDEPENDENT_AMBULATORY_CARE_PROVIDER_SITE_OTHER): Payer: BLUE CROSS/BLUE SHIELD | Admitting: Family Medicine

## 2020-12-17 VITALS — BP 113/72 | HR 92 | Wt 189.0 lb

## 2020-12-17 DIAGNOSIS — Z3A39 39 weeks gestation of pregnancy: Secondary | ICD-10-CM

## 2020-12-17 DIAGNOSIS — Z3480 Encounter for supervision of other normal pregnancy, unspecified trimester: Secondary | ICD-10-CM

## 2020-12-17 DIAGNOSIS — Z8659 Personal history of other mental and behavioral disorders: Secondary | ICD-10-CM

## 2020-12-17 NOTE — Progress Notes (Signed)
   PRENATAL VISIT NOTE  Subjective:  Lynn Moses is a 24 y.o. G1P0 at [redacted]w[redacted]d being seen today for ongoing prenatal care.  She is currently monitored for the following issues for this low-risk pregnancy and has Supervision of other normal pregnancy, antepartum and History of depression on their problem list.  Patient reports occasional contractions.  Contractions: Irregular. Vag. Bleeding: None.  Movement: Present. Denies leaking of fluid.   The following portions of the patient's history were reviewed and updated as appropriate: allergies, current medications, past family history, past medical history, past social history, past surgical history and problem list.   Objective:   Vitals:   12/17/20 1549  BP: 113/72  Pulse: 92  Weight: 189 lb 0.6 oz (85.7 kg)    Fetal Status: Fetal Heart Rate (bpm): 144 Fundal Height: 39 cm Movement: Present  Presentation: Vertex  General:  Alert, oriented and cooperative. Patient is in no acute distress.  Skin: Skin is warm and dry. No rash noted.   Cardiovascular: Normal heart rate noted  Respiratory: Normal respiratory effort, no problems with respiration noted  Abdomen: Soft, gravid, appropriate for gestational age.  Pain/Pressure: Present     Pelvic: Cervical exam performed in the presence of a chaperone Dilation: 3.5 Effacement (%): 80 Station: -3  Extremities: Normal range of motion.  Edema: None  Mental Status: Normal mood and affect. Normal behavior. Normal judgment and thought content.   Assessment and Plan:  Pregnancy: G1P0 at [redacted]w[redacted]d 1. [redacted] weeks gestation of pregnancy  2. Supervision of other normal pregnancy, antepartum FHT and FH normal. Induction tomorrow for term with favorable cervix. She starts school in mid-January and wants as much time off as possible before going back.  3. History of depression   Term labor symptoms and general obstetric precautions including but not limited to vaginal bleeding, contractions, leaking of fluid  and fetal movement were reviewed in detail with the patient. Please refer to After Visit Summary for other counseling recommendations.   No follow-ups on file.  Future Appointments  Date Time Provider Department Center  12/18/2020  6:30 AM [facility] SCHED ROOM [facility] None  01/22/2021  2:15 PM Levie Heritage, DO [facility] None    Levie Heritage, DO

## 2020-12-18 ENCOUNTER — Inpatient Hospital Stay (HOSPITAL_COMMUNITY): Payer: BLUE CROSS/BLUE SHIELD | Admitting: Anesthesiology

## 2020-12-18 ENCOUNTER — Inpatient Hospital Stay (HOSPITAL_COMMUNITY)
Admission: AD | Admit: 2020-12-18 | Discharge: 2020-12-20 | DRG: 807 | Disposition: A | Discharge status: Home / Self Care | Payer: BLUE CROSS/BLUE SHIELD | Attending: Obstetrics & Gynecology | Admitting: Obstetrics & Gynecology

## 2020-12-18 ENCOUNTER — Inpatient Hospital Stay (HOSPITAL_COMMUNITY): Payer: BLUE CROSS/BLUE SHIELD

## 2020-12-18 ENCOUNTER — Encounter (HOSPITAL_COMMUNITY): Payer: Self-pay | Admitting: Family Medicine

## 2020-12-18 DIAGNOSIS — Z23 Encounter for immunization: Secondary | ICD-10-CM | POA: Diagnosis not present

## 2020-12-18 DIAGNOSIS — Z3480 Encounter for supervision of other normal pregnancy, unspecified trimester: Secondary | ICD-10-CM

## 2020-12-18 DIAGNOSIS — O480 Post-term pregnancy: Secondary | ICD-10-CM | POA: Diagnosis not present

## 2020-12-18 DIAGNOSIS — Z2839 Other underimmunization status: Secondary | ICD-10-CM

## 2020-12-18 DIAGNOSIS — Z9889 Other specified postprocedural states: Secondary | ICD-10-CM | POA: Diagnosis not present

## 2020-12-18 DIAGNOSIS — Z20822 Contact with and (suspected) exposure to covid-19: Secondary | ICD-10-CM | POA: Diagnosis present

## 2020-12-18 DIAGNOSIS — Z3490 Encounter for supervision of normal pregnancy, unspecified, unspecified trimester: Secondary | ICD-10-CM | POA: Diagnosis present

## 2020-12-18 DIAGNOSIS — O09899 Supervision of other high risk pregnancies, unspecified trimester: Secondary | ICD-10-CM

## 2020-12-18 DIAGNOSIS — O26893 Other specified pregnancy related conditions, third trimester: Secondary | ICD-10-CM | POA: Diagnosis present

## 2020-12-18 DIAGNOSIS — Z3A40 40 weeks gestation of pregnancy: Secondary | ICD-10-CM | POA: Diagnosis not present

## 2020-12-18 DIAGNOSIS — Z8659 Personal history of other mental and behavioral disorders: Secondary | ICD-10-CM

## 2020-12-18 LAB — TYPE AND SCREEN
ABO/RH(D): A POS
Antibody Screen: NEGATIVE

## 2020-12-18 LAB — RESP PANEL BY RT-PCR (FLU A&B, COVID) ARPGX2
Influenza A by PCR: NEGATIVE
Influenza B by PCR: NEGATIVE
SARS Coronavirus 2 by RT PCR: NEGATIVE

## 2020-12-18 LAB — CBC
HCT: 41.2 % (ref 36.0–46.0)
Hemoglobin: 13.1 g/dL (ref 12.0–15.0)
MCH: 27.1 pg (ref 26.0–34.0)
MCHC: 31.8 g/dL (ref 30.0–36.0)
MCV: 85.1 fL (ref 80.0–100.0)
Platelets: 242 10*3/uL (ref 150–400)
RBC: 4.84 MIL/uL (ref 3.87–5.11)
RDW: 13.6 % (ref 11.5–15.5)
WBC: 13.3 10*3/uL — ABNORMAL HIGH (ref 4.0–10.5)
nRBC: 0 % (ref 0.0–0.2)

## 2020-12-18 LAB — RPR: RPR Ser Ql: NONREACTIVE

## 2020-12-18 MED ORDER — EPHEDRINE 5 MG/ML INJ: 10.0000 mg | INTRAVENOUS | Status: DC | PRN

## 2020-12-18 MED ORDER — MISOPROSTOL 50MCG HALF TABLET
ORAL_TABLET | ORAL | Status: AC
  Filled 2020-12-18: qty 1

## 2020-12-18 MED ORDER — OXYTOCIN BOLUS FROM INFUSION: 333.0000 mL | Freq: Once | INTRAVENOUS | Status: DC

## 2020-12-18 MED ORDER — SOD CITRATE-CITRIC ACID 500-334 MG/5ML PO SOLN: 30.0000 mL | ORAL | Status: DC | PRN

## 2020-12-18 MED ORDER — OXYCODONE-ACETAMINOPHEN 5-325 MG PO TABS: 1.0000 | ORAL_TABLET | ORAL | Status: DC | PRN

## 2020-12-18 MED ORDER — PHENYLEPHRINE 40 MCG/ML (10ML) SYRINGE FOR IV PUSH (FOR BLOOD PRESSURE SUPPORT)
80.0000 ug | PREFILLED_SYRINGE | INTRAVENOUS | Status: DC | PRN
  Filled 2020-12-18: qty 10

## 2020-12-18 MED ORDER — FENTANYL-BUPIVACAINE-NACL 0.5-0.125-0.9 MG/250ML-% EP SOLN
12.0000 mL/h | EPIDURAL | Status: DC | PRN
Start: 2020-12-18 — End: 2020-12-19
  Filled 2020-12-18: qty 250

## 2020-12-18 MED ORDER — SODIUM CHLORIDE (PF) 0.9 % IJ SOLN
INTRAMUSCULAR | Status: DC | PRN
  Administered 2020-12-18: 12 mL/h via EPIDURAL

## 2020-12-18 MED ORDER — PHENYLEPHRINE 40 MCG/ML (10ML) SYRINGE FOR IV PUSH (FOR BLOOD PRESSURE SUPPORT)
80.0000 ug | PREFILLED_SYRINGE | INTRAVENOUS | Status: DC | PRN
  Administered 2020-12-18 (×2): 80 ug via INTRAVENOUS

## 2020-12-18 MED ORDER — LIDOCAINE HCL (PF) 1 % IJ SOLN
INTRAMUSCULAR | Status: DC | PRN
  Administered 2020-12-18: 4 mL via EPIDURAL
  Administered 2020-12-18: 5 mL via EPIDURAL

## 2020-12-18 MED ORDER — ACETAMINOPHEN 325 MG PO TABS: 650.0000 mg | ORAL_TABLET | ORAL | Status: DC | PRN

## 2020-12-18 MED ORDER — OXYTOCIN-SODIUM CHLORIDE 30-0.9 UT/500ML-% IV SOLN: 2.5000 [IU]/h | INTRAVENOUS | Status: DC

## 2020-12-18 MED ORDER — TERBUTALINE SULFATE 1 MG/ML IJ SOLN: 0.2500 mg | Freq: Once | INTRAMUSCULAR | Status: DC | PRN

## 2020-12-18 MED ORDER — LIDOCAINE HCL (PF) 1 % IJ SOLN: 30.0000 mL | INTRAMUSCULAR | Status: DC | PRN

## 2020-12-18 MED ORDER — MISOPROSTOL 50MCG HALF TABLET
50.0000 ug | ORAL_TABLET | ORAL | Status: DC
  Administered 2020-12-18: 50 ug via BUCCAL

## 2020-12-18 MED ORDER — LACTATED RINGERS IV SOLN: 500.0000 mL | Freq: Once | INTRAVENOUS | Status: DC

## 2020-12-18 MED ORDER — OXYTOCIN-SODIUM CHLORIDE 30-0.9 UT/500ML-% IV SOLN
1.0000 m[IU]/min | INTRAVENOUS | Status: DC
  Administered 2020-12-18 (×2): 2 m[IU]/min via INTRAVENOUS
  Filled 2020-12-18: qty 500

## 2020-12-18 MED ORDER — LACTATED RINGERS IV SOLN: INTRAVENOUS | Status: DC

## 2020-12-18 MED ORDER — OXYCODONE-ACETAMINOPHEN 5-325 MG PO TABS: 2.0000 | ORAL_TABLET | ORAL | Status: DC | PRN

## 2020-12-18 MED ORDER — DIPHENHYDRAMINE HCL 50 MG/ML IJ SOLN: 12.5000 mg | INTRAMUSCULAR | Status: DC | PRN

## 2020-12-18 MED ORDER — ONDANSETRON HCL 4 MG/2ML IJ SOLN: 4.0000 mg | Freq: Four times a day (QID) | INTRAMUSCULAR | Status: DC | PRN

## 2020-12-18 MED ORDER — LACTATED RINGERS IV SOLN
500.0000 mL | INTRAVENOUS | Status: DC | PRN
  Administered 2020-12-18: 1000 mL via INTRAVENOUS

## 2020-12-18 NOTE — Anesthesia Procedure Notes (Signed)
Epidural Patient location during procedure: OB Start time: 12/18/2020 8:02 PM End time: 12/18/2020 8:06 PM  Staffing Anesthesiologist: Beryle Lathe, MD Performed: anesthesiologist   Preanesthetic Checklist Completed: patient identified, IV checked, risks and benefits discussed, monitors and equipment checked, pre-op evaluation and timeout performed  Epidural Patient position: sitting Prep: DuraPrep Patient monitoring: continuous pulse ox and blood pressure Approach: midline Location: L2-L3 Injection technique: LOR saline  Needle:  Needle type: Tuohy  Needle gauge: 17 G Needle length: 9 cm Needle insertion depth: 7 cm Catheter size: 19 Gauge Catheter at skin depth: 12 cm Test dose: negative and Other (1% lidocaine)  Assessment Events: blood not aspirated  Additional Notes Patient identified. Risks including, but not limited to, bleeding, infection, nerve damage, paralysis, inadequate analgesia, blood pressure changes, nausea, vomiting, allergic reaction, postpartum back pain, itching, and headache were discussed. Patient expressed understanding and wished to proceed. Sterile prep and drape, including hand hygiene, mask, and sterile gloves were used. The patient was positioned and the spine was prepped. The skin was anesthetized with lidocaine. No paraesthesia or other complication noted. The patient did not experience any signs of intravascular injection such as tinnitus or metallic taste in mouth, nor signs of intrathecal spread such as rapid motor block. Please see nursing notes for vital signs. The patient tolerated the procedure well.   [name], MDReason for block:procedure for pain

## 2020-12-18 NOTE — Progress Notes (Addendum)
Labor Progress Note Thalya Fouche is a 24 y.o. G1P0 at 77w0dpresented for eIOL. S: Doing well without complaints.  O:  BP 109/67   Pulse 84   Temp 98.4 F (36.9 C) (Oral)   Resp 18   Ht _0  (1.676 m)   Wt 85.7 kg   LMP 03/13/2020 (Exact Date)   SpO2 98%   BMI 30.51 kg/m  EFM: baseline 140bpm/mod variability/+accels/intermittent variable and late decels for a short time period, have since resolved Toco: q1-5 min  CVE: Dilation: 4 Effacement (%): 30 Cervical Position: Middle Station: -2 Exam by:: Dr. FSylvester Harder  A&P: 24y.o. G1P0 480w0dresented for eIOL. #IOL: Pitocin previously started at 0830 per RN exam, pitocin has not been above 6 and has been decreased multiple times per RN given variable and late intermittent decels previously on toco. Cervix is still quite thick on my exam and suspect patient would benefit from cytotec at this time. Will stop pitocin and dose 5038mbuccal cytotec and re-evaluate in 4 hours. Patient amendable to plan. #Pain: PRN #FWB: cat 2, overall reassuring #GBS negative #History of depression: not on meds, SW consult postpartum #rubella NI: MMR postpartum  AliArrie SenateD 4:41 PM

## 2020-12-18 NOTE — Plan of Care (Signed)
  Problem: Education: Goal: Knowledge of Childbirth will improve Outcome: Progressing Goal: Ability to make informed decisions regarding treatment and plan of care will improve Outcome: Progressing Goal: Ability to state and carry out methods to decrease the pain will improve Outcome: Progressing Goal: Individualized Educational Video(s) Outcome: Progressing   

## 2020-12-18 NOTE — Discharge Instructions (Signed)

## 2020-12-18 NOTE — Anesthesia Preprocedure Evaluation (Signed)
Anesthesia Evaluation  Patient identified by MRN, date of birth, ID band Patient awake    Reviewed: Allergy & Precautions, NPO status , Patient's Chart, lab work & pertinent test results  History of Anesthesia Complications Negative for: history of anesthetic complications  Airway Mallampati: II   Neck ROM: Full    Dental   Pulmonary neg pulmonary ROS,    Pulmonary exam normal        Cardiovascular negative cardio ROS Normal cardiovascular exam     Neuro/Psych PSYCHIATRIC DISORDERS Anxiety Depression negative neurological ROS     GI/Hepatic negative GI ROS, Neg liver ROS,   Endo/Other  negative endocrine ROS  Renal/GU negative Renal ROS     Musculoskeletal negative musculoskeletal ROS (+)   Abdominal   Peds  Hematology negative hematology ROS (+)  Plt 242k    Anesthesia Other Findings   Reproductive/Obstetrics (+) Pregnancy                             Anesthesia Physical Anesthesia Plan  ASA: II  Anesthesia Plan: Epidural   Post-op Pain Management:    Induction:   PONV Risk Score and Plan: 2 and Treatment may vary due to age or medical condition  Airway Management Planned: Natural Airway  Additional Equipment: None  Intra-op Plan:   Post-operative Plan:   Informed Consent: I have reviewed the patients History and Physical, chart, labs and discussed the procedure including the risks, benefits and alternatives for the proposed anesthesia with the patient or authorized representative who has indicated his/her understanding and acceptance.       Plan Discussed with: Anesthesiologist  Anesthesia Plan Comments: (Labs reviewed. Platelets acceptable, patient not taking any blood thinning medications. Per RN, FHR tracing reported to be stable enough for sitting procedure. Risks and benefits discussed with patient, including PDPH, backache, epidural hematoma, failed epidural,  blood pressure changes, allergic reaction, and nerve injury. Patient expressed understanding and wished to proceed.)        Anesthesia Quick Evaluation

## 2020-12-18 NOTE — Discharge Summary (Signed)
Postpartum Discharge Summary     Patient Name: Lynn Moses DOB: 21-Jan-1996 MRN: [id number]  Date of admission: 12/18/2020 Delivery date:12/19/2020  Delivering provider: Christin Fudge  Date of discharge: 12/20/2020  Admitting diagnosis: Encounter for induction of labor [Z34.90] Intrauterine pregnancy: [redacted]w[redacted]d    Secondary diagnosis:  Active Problems:   Supervision of other normal pregnancy, antepartum   History of depression   Encounter for induction of labor   Rubella non-immune status, antepartum  Additional problems: none    Discharge diagnosis: Vaginal delivery                                       Post partum procedures: MMR offered prior to discharge Augmentation: AROM, Pitocin and Cytotec Complications: None  Hospital course: Induction of Labor With Vaginal Delivery   24y.o. yo G1P0 at 440w1das admitted to the hospital 12/18/2020 for induction of labor.  Indication for induction: Elective.  Patient had an uncomplicated labor course as follows: Membrane Rupture Time/Date: 6:17 PM ,12/18/2020   Delivery Method:Vaginal, Vacuum (Extractor)  deep variables during 2nd stage, VAD w/o complications Episiotomy: None  Lacerations:  2nd degree  Details of delivery can be found in separate delivery note.  Patient had a routine postpartum course. Patient is discharged home 12/20/20.  Newborn Data: Birth date:12/19/2020  Birth time:12:16 AM  Gender:Female  Living status:Living  Apgars:7 ,9  Weight:2775 g   Magnesium Sulfate received: No BMZ received: No Rhophylac:N/A MMR: offered prior to discharge T-DaP:declined Flu: declined Transfusion:No  Physical exam  Vitals:   12/19/20 1035 12/19/20 1454 12/19/20 2030 12/20/20 0500  BP: 100/65 (!) 102/56 114/66 121/85  Pulse: 74 73 76 81  Resp: 16 17 17 18   Temp: 97.8 F (36.6 C) 97.7 F (36.5 C) 98.4 F (36.9 C) 97.9 F (36.6 C)  TempSrc: Oral Oral Oral Oral  SpO2: 99% 99% 100% 100%  Weight:      Height:        General: alert, cooperative and no distress Lochia: appropriate Uterine Fundus: firm Incision: N/A DVT Evaluation: No evidence of DVT seen on physical exam. No cords or calf tenderness. No significant calf/ankle edema. Labs: Lab Results  Component Value Date   WBC 13.3 (H) 12/18/2020   HGB 13.1 12/18/2020   HCT 41.2 12/18/2020   MCV 85.1 12/18/2020   PLT 242 12/18/2020   CMP Latest Ref Rng & Units 08/07/2020  Glucose 70 - 99 mg/dL 79  BUN 6 - 20 mg/dL 11  Creatinine 0.44 - 1.00 mg/dL 0.65  Sodium 135 - 145 mmol/L 134(L)  Potassium 3.5 - 5.1 mmol/L 4.0  Chloride 98 - 111 mmol/L 102  CO2 22 - 32 mmol/L 23  Calcium 8.9 - 10.3 mg/dL 9.4  Total Protein 6.5 - 8.1 g/dL 6.1(L)  Total Bilirubin 0.3 - 1.2 mg/dL 0.4  Alkaline Phos 38 - 126 U/L 37(L)  AST 15 - 41 U/L 14(L)  ALT 0 - 44 U/L 14   Edinburgh Score: Edinburgh Postnatal Depression Scale Screening Tool 12/19/2020  I have been able to laugh and see the funny side of things. 1  I have looked forward with enjoyment to things. 0  I have blamed myself unnecessarily when things went wrong. 3  I have been anxious or worried for no good reason. 2  I have felt scared or panicky for no good reason. 2  Things have been getting on  top of me. 2  I have been so unhappy that I have had difficulty sleeping. 1  I have felt sad or miserable. 1  I have been so unhappy that I have been crying. 1  The thought of harming myself has occurred to me. 0  Edinburgh Postnatal Depression Scale Total 13     After visit meds:  Allergies as of 12/20/2020   No Known Allergies     Medication List    TAKE these medications   acetaminophen 325 MG tablet Commonly known as: Tylenol Take 2 tablets (650 mg total) by mouth every 6 (six) hours as needed for mild pain, moderate pain, fever or headache (for pain scale < 4).   coconut oil Oil Apply 1 application topically as needed (nipple pain).   ferrous sulfate 325 (65 FE) MG tablet Take 1  tablet (325 mg total) by mouth every other day. Start taking on: December 21, 2020   ibuprofen 600 MG tablet Commonly known as: ADVIL Take 1 tablet (600 mg total) by mouth every 8 (eight) hours as needed for moderate pain or cramping.   prenatal vitamin w/FE, FA 27-1 MG Tabs tablet Take 1 tablet by mouth daily at 12 noon.        Discharge home in stable condition Infant Feeding: Breast Infant Disposition:home with mother Discharge instruction: per After Visit Summary and Postpartum booklet. Activity: Advance as tolerated. Pelvic rest for 6 weeks.  Diet: routine diet Future Appointments: Future Appointments  Date Time Provider [place]  01/22/2021  2:15 PM Truett Mainland, DO [facility] None   Follow up Visit:  [place] Follow up on 01/22/2021.   Specialty: Obstetrics and Gynecology Why: for postpartum checkup Contact information: [place] [address] [phone]              Please schedule this patient for a Virtual postpartum visit in 4 weeks with the following provider: Any provider. Additional Postpartum F/U:  Low risk pregnancy complicated by:  Delivery mode:  Vaginal, Vacuum Neurosurgeon)  Anticipated Birth Control:  declines   Annalysse Shoemaker, Gildardo Cranker, MD OB Fellow, Faculty Practice 12/20/2020 8:43 AM

## 2020-12-18 NOTE — H&P (Signed)
Lynn Moses is a 24 y.o. female G1P0 with IUP at 39w0dby LMP presenting for elective IOL. She reports +FMs, No LOF, no VB, no blurry vision, headaches or peripheral edema, and RUQ pain.  She plans on breast feeding. She declines birth control. [facility]  Dating: By LMP --->  Estimated Date of Delivery: 12/18/20  Sono:    08/22/20_0 , CWD, normal anatomy, cephalic presentation, 5616W 28% EFW   Prenatal History/Complications:  Rubella non immune History of depression (not on meds)  Past Medical History: Past Medical History:  Diagnosis Date   Anxiety    Depression 2020   pt has stopped taking her Zoloft when she was trying to get pregnant   Ovarian cyst 01/2020    Past Surgical History: Past Surgical History:  Procedure Laterality Date   NO PAST SURGERIES      Obstetrical History: OB History    Gravida  1   Para      Term      Preterm      AB      Living        SAB      IAB      Ectopic      Multiple      Live Births              Social History Social History   Socioeconomic History   Marital status: Single    Spouse name: Not on file   Number of children: Not on file   Years of education: 13   Highest education level: Some college, no degree  Occupational History   Not on file  Tobacco Use   Smoking status: Never Smoker   Smokeless tobacco: Never Used  VScientific laboratory technicianUse: Never used  Substance and Sexual Activity   Alcohol use: Not Currently   Drug use: Never   Sexual activity: Yes  Other Topics Concern   Not on file  Social History Narrative   Not on file   Social Determinants of Health   Financial Resource Strain: Not on file  Food Insecurity: Not on file  Transportation Needs: Not on file  Physical Activity: Not on file  Stress: Not on file  Social Connections: Not on file    Family History: Family History  Problem  Relation Age of Onset   Hypertension Maternal Grandmother    Cancer Paternal Grandmother    Varicose Veins Mother     Allergies: No Known Allergies  Medications Prior to Admission  Medication Sig Dispense Refill Last Dose   prenatal vitamin w/FE, FA (PRENATAL 1 + 1) 27-1 MG TABS tablet Take 1 tablet by mouth daily at 12 noon.   12/18/2020 at 0715     Review of Systems   All systems reviewed and negative except as stated in HPI  Blood pressure 114/79, pulse 98, temperature 98 F (36.7 C), temperature source Oral, resp. rate 18, height 5' 6" (1.676 m), weight 85.7 kg, last menstrual period 03/13/2020. General appearance: alert, cooperative and no distress Lungs: normal respiratory effort Heart: regular rate and rhythm Abdomen: soft, non-tender; gravid Pelvic: as noted below Extremities: Homans sign is negative, no sign of DVT Presentation: cephalic by RN exam Fetal monitoringBaseline: 130 bpm, Variability: Good {> 6 bpm), Accelerations: Reactive and Decelerations: Absent Uterine activityFrequency: Every 1-5 minutes Dilation: 4 Effacement (%): 50 Station: -2 Exam by:: RLamont Dowdy RN   Prenatal labs:  ABO, Rh: --/--/A POS (12/23 0743) Antibody: NEG (12/23 0743) Rubella:  non imm RPR: Non Reactive (10/08 0844)  HBsAg:   nonreactive HIV: Non Reactive (10/08 0844)  GBS: Negative/-- (11/29 0828)  2 hr Glucola passed Genetic screening  Not done Anatomy US normal  Prenatal Transfer Tool  Maternal Diabetes: No Genetic Screening: Declined Maternal Ultrasounds/Referrals: Normal Fetal Ultrasounds or other Referrals:  None Maternal Substance Abuse:  No Significant Maternal Medications:  None Significant Maternal Lab Results: Group B Strep negative  Results for orders placed or performed during the hospital encounter of 12/18/20 (from the past 24 hour(s))  Type and screen   Collection Time: 12/18/20  7:43 AM  Result Value Ref Range   ABO/RH(D) A POS    Antibody  Screen NEG    Sample Expiration      12/21/2020,2359 Performed at [facility], [place] [address] [place], [place] [address]   CBC   Collection Time: 12/18/20  7:58 AM  Result Value Ref Range   WBC 13.3 (H) 4.0 - 10.5 K/uL   RBC 4.84 3.87 - 5.11 MIL/uL   Hemoglobin 13.1 12.0 - 15.0 g/dL   HCT 41.2 36.0 - 46.0 %   MCV 85.1 80.0 - 100.0 fL   MCH 27.1 26.0 - 34.0 pg   MCHC 31.8 30.0 - 36.0 g/dL   RDW 13.6 11.5 - 15.5 %   Platelets 242 150 - 400 K/uL   nRBC 0.0 0.0 - 0.2 %  Resp Panel by RT-PCR (Flu A&B, Covid) Nasopharyngeal Swab   Collection Time: 12/18/20  8:05 AM   Specimen: Nasopharyngeal Swab; Nasopharyngeal(NP) swabs in vial transport medium  Result Value Ref Range   SARS Coronavirus 2 by RT PCR NEGATIVE NEGATIVE   Influenza A by PCR NEGATIVE NEGATIVE   Influenza B by PCR NEGATIVE NEGATIVE    Patient Active Problem List   Diagnosis Date Noted   Encounter for induction of labor 12/18/2020   Rubella non-immune status, antepartum 12/18/2020   Supervision of other normal pregnancy, antepartum 06/12/2020   History of depression 06/12/2020    Assessment/Plan:  Lynn Moses is a 24 y.o. G1P0 at 102w0dhere for eIOL.  #IOL: Discussed induction process with patient. Given cervical exam, will start pitocin and continue to titrate. #Pain: PRN #FWB:  cat 1 #ID: GBS  neg #MOF: breast #MOC: declines #Circ: yes #History of depression: not on meds, SW consult postpartum #rubella NI: MMR postpartum  AArrie Senate MD  12/18/2020, 9:14 AM

## 2020-12-18 NOTE — Progress Notes (Signed)
Labor Progress Note Lynn Moses is a 24 y.o. G1P0 at 58w0dpresented for eIOL. S: Doing well without complaints.  O:  BP 109/67   Pulse 84   Temp 98.4 F (36.9 C) (Oral)   Resp 18   Ht 5' 6"  (1.676 m)   Wt 85.7 kg   LMP 03/13/2020 (Exact Date)   SpO2 99%   BMI 30.51 kg/m  EFM: baseline 150bpm/mod variability/+accels/intermittent variable and late decels for a short time period, have since resolved Toco: q1-5 min  CVE: Dilation: 4 Effacement (%): 50 Cervical Position: Middle Station: -2 Exam by:: Dr. FSylvester Harder  A&P: 24y.o. G1P0 474w0dresented for eIOL. #IOL: Patient 0830-1400 and discontinued given cervix still thick. S/p cytotec. Cervix has thinned out since last check. Will start pit 2x2 and continue to titrate. AROM performed with clear fluid. #Pain: PRN #FWB: cat 2 for short time, now cat 1, overall reassuring #GBS negative #History of depression: not on meds, SW consult postpartum #rubella NI: MMR postpartum  AlArrie SenateMD 6:44 PM

## 2020-12-19 ENCOUNTER — Encounter (HOSPITAL_COMMUNITY): Payer: Self-pay | Admitting: Family Medicine

## 2020-12-19 DIAGNOSIS — O480 Post-term pregnancy: Secondary | ICD-10-CM

## 2020-12-19 DIAGNOSIS — Z3A40 40 weeks gestation of pregnancy: Secondary | ICD-10-CM

## 2020-12-19 MED ORDER — BISACODYL 10 MG RE SUPP: 10.0000 mg | Freq: Every day | RECTAL | Status: DC | PRN

## 2020-12-19 MED ORDER — ONDANSETRON HCL 4 MG/2ML IJ SOLN: 4.0000 mg | INTRAMUSCULAR | Status: DC | PRN

## 2020-12-19 MED ORDER — DIPHENHYDRAMINE HCL 25 MG PO CAPS: 25.0000 mg | ORAL_CAPSULE | Freq: Four times a day (QID) | ORAL | Status: DC | PRN

## 2020-12-19 MED ORDER — WITCH HAZEL-GLYCERIN EX PADS: 1.0000 "application " | MEDICATED_PAD | CUTANEOUS | Status: DC | PRN

## 2020-12-19 MED ORDER — FERROUS SULFATE 325 (65 FE) MG PO TABS
325.0000 mg | ORAL_TABLET | ORAL | Status: DC
  Administered 2020-12-19: 325 mg via ORAL
  Filled 2020-12-19: qty 1

## 2020-12-19 MED ORDER — SIMETHICONE 80 MG PO CHEW: 80.0000 mg | CHEWABLE_TABLET | ORAL | Status: DC | PRN

## 2020-12-19 MED ORDER — TETANUS-DIPHTH-ACELL PERTUSSIS 5-2.5-18.5 LF-MCG/0.5 IM SUSY
0.5000 mL | PREFILLED_SYRINGE | Freq: Once | INTRAMUSCULAR | Status: DC
Start: 2020-12-20 — End: 2020-12-20

## 2020-12-19 MED ORDER — MEASLES, MUMPS & RUBELLA VAC IJ SOLR
0.5000 mL | Freq: Once | INTRAMUSCULAR | Status: AC
  Administered 2020-12-20: 0.5 mL via SUBCUTANEOUS
  Filled 2020-12-19: qty 0.5

## 2020-12-19 MED ORDER — ACETAMINOPHEN 325 MG PO TABS: 650.0000 mg | ORAL_TABLET | ORAL | Status: DC | PRN

## 2020-12-19 MED ORDER — ONDANSETRON HCL 4 MG PO TABS: 4.0000 mg | ORAL_TABLET | ORAL | Status: DC | PRN

## 2020-12-19 MED ORDER — PRENATAL MULTIVITAMIN CH
1.0000 | ORAL_TABLET | Freq: Every day | ORAL | Status: DC
  Administered 2020-12-19 – 2020-12-20 (×2): 1 via ORAL
  Filled 2020-12-19 (×2): qty 1

## 2020-12-19 MED ORDER — DOCUSATE SODIUM 100 MG PO CAPS
100.0000 mg | ORAL_CAPSULE | Freq: Two times a day (BID) | ORAL | Status: DC
Start: 2020-12-19 — End: 2020-12-20
  Administered 2020-12-19 – 2020-12-20 (×2): 100 mg via ORAL
  Filled 2020-12-19 (×3): qty 1

## 2020-12-19 MED ORDER — METHYLERGONOVINE MALEATE 0.2 MG/ML IJ SOLN: 0.2000 mg | INTRAMUSCULAR | Status: DC | PRN

## 2020-12-19 MED ORDER — BENZOCAINE-MENTHOL 20-0.5 % EX AERO
1.0000 "application " | INHALATION_SPRAY | CUTANEOUS | Status: DC | PRN
  Administered 2020-12-19 – 2020-12-20 (×2): 1 via TOPICAL
  Filled 2020-12-19 (×2): qty 56

## 2020-12-19 MED ORDER — FLEET ENEMA 7-19 GM/118ML RE ENEM: 1.0000 | ENEMA | Freq: Every day | RECTAL | Status: DC | PRN

## 2020-12-19 MED ORDER — IBUPROFEN 600 MG PO TABS
600.0000 mg | ORAL_TABLET | Freq: Four times a day (QID) | ORAL | Status: DC
  Administered 2020-12-19 – 2020-12-20 (×6): 600 mg via ORAL
  Filled 2020-12-19 (×6): qty 1

## 2020-12-19 MED ORDER — METHYLERGONOVINE MALEATE 0.2 MG PO TABS: 0.2000 mg | ORAL_TABLET | ORAL | Status: DC | PRN

## 2020-12-19 MED ORDER — COCONUT OIL OIL: 1.0000 "application " | TOPICAL_OIL | Status: DC | PRN

## 2020-12-19 MED ORDER — MEDROXYPROGESTERONE ACETATE 150 MG/ML IM SUSP: 150.0000 mg | INTRAMUSCULAR | Status: DC | PRN

## 2020-12-19 MED ORDER — DIBUCAINE (PERIANAL) 1 % EX OINT: 1.0000 "application " | TOPICAL_OINTMENT | CUTANEOUS | Status: DC | PRN

## 2020-12-19 NOTE — Anesthesia Postprocedure Evaluation (Signed)
Anesthesia Post Note  Patient: Lynn Moses  Procedure(s) Performed: AN AD HOC LABOR EPIDURAL     Patient location during evaluation: Mother Baby Anesthesia Type: Epidural Level of consciousness: awake Pain management: satisfactory to patient Vital Signs Assessment: post-procedure vital signs reviewed and stable Respiratory status: spontaneous breathing Cardiovascular status: stable Anesthetic complications: no   No complications documented.  Last Vitals:  Vitals:   12/19/20 0315 12/19/20 0655  BP: 119/72 107/66  Pulse: 80 81  Resp: 18 18  Temp: 36.6 C 36.7 C  SpO2: 100% 99%    Last Pain:  Vitals:   12/19/20 0655  TempSrc: Oral  PainSc: 0-No pain   Pain Goal: Patients Stated Pain Goal: 2 (12/19/20 0524)                 Cephus Shelling

## 2020-12-19 NOTE — Progress Notes (Addendum)
CSW received consult for hx of marijuana use.  Referral was screened out due to the following: ~MOB had no documented substance use after initial prenatal visit/+UPT. ~MOB had no positive drug screens after initial prenatal visit/+UPT.  ~Baby's UDS is negative.  Please consult CSW if current concerns arise or by MOB's request.  CSW will monitor CDS results and make report to Child Protective Services if warranted.      CSW received consult for hx of Anxiety and Depression as well as THC use earlier in pregnancy.  CSW met with MOB to offer support and complete assessment.    CSW congratulated MOB on the birth of infant. CSW advised MOB of CSW's role and the reason for CSW coming to speak with her. MOB expressed that she was diagnosed with anxiety and depression at the start of 2019. MOB expressed that she was previously on Zoloft however MOB reported that she stopped taking this medciation "almost two years ago". CSW inquired from MOB on how her anxiety and depression has been since ceasing medication use in which MOB reported that her anxiety and depression has been well. MOB expressed to CSW that she is seeing a therapist. MOB declined further therapy resources for the post partum period at this time. MOB identified no other mental health hx to CSW and denies SI. HI and DV to CSW.   CSW inquired from MOB on who her supports are. MOB expressed that her priam support is her boyfriend. MOB reported that they have all needed items to care for infant with plans for infant to sleep in basinet once arrived home. MOB expressed no other needs and reports that since giving birth she has felt good.   CSW provided education regarding the baby blues period vs. perinatal mood disorders, discussed treatment and gave resources for mental health follow up if concerns arise.  CSW recommends self-evaluation during the postpartum time period using the New Mom Checklist from Postpartum Progress and encouraged MOB to  contact a medical professional if symptoms are noted at any time.   CSW provided review of Sudden Infant Death Syndrome (SIDS) precautions.    CSW identifies no further need for intervention and no barriers to discharge at this time.   Lynn Moses, MSW, LCSW [facility] [phone]   

## 2020-12-19 NOTE — Progress Notes (Signed)
CSW acknowledges reconsult for edinburgh 13. CSW has provided MOB with PPD education on assessment.    Claude Manges Vani Gunner, MSW, LCSW [facility] [place] [phone]

## 2020-12-19 NOTE — Lactation Note (Signed)
This note was copied from a baby's chart. Lactation Consultation Note  Patient Name: Lynn Moses [id number] Date: 12/19/2020 Reason for consult: Term;Primapara;1st time breastfeeding Age:24 hours   P1 mother whose infant is now 37 hours old.  This is a term baby at 40+1 weeks.  Mother's feeding preference is breast/bottle.    Reviewed breast feeding basics with parents.  Taught hand expression and mother was able to express colostrum.  Container provided and milk storage times reviewed.  Finger feeding demonstrated.  Baby was starting to arouse; offered to assist with latching and mother agreeable.  Asked father to remove clothing for STS feeding and check diaper.  Assisted baby to latch in the cross cradle position on the right breast easily.  He had a wide gape and flanged lips.  Observed him feeding for 8 minutes while providing education.  Mother pleased.  Father helpful and supportive.  Mother has a DEBP for home use.  She will call her RN/LC for latch assistance as needed.  RN updated.   Maternal Data Formula Feeding for Exclusion: Yes Reason for exclusion: Mother's choice to formula and breast feed on admission Has patient been taught Hand Expression?: Yes Does the patient have breastfeeding experience prior to this delivery?: No  Feeding Feeding Type: Breast Fed  LATCH Score Latch: Grasps breast easily, tongue down, lips flanged, rhythmical sucking.  Audible Swallowing: None  Type of Nipple: Everted at rest and after stimulation  Comfort (Breast/Nipple): Soft / non-tender  Hold (Positioning): Assistance needed to correctly position infant at breast and maintain latch.  LATCH Score: 7  Interventions Interventions: Breast feeding basics reviewed;Assisted with latch;Skin to skin;Breast massage;Hand express;Breast compression;Adjust position;Coconut oil;Position options;Support pillows  Lactation Tools Discussed/Used     Consult Status Consult Status:  Follow-up Date: 12/20/20 Follow-up type: In-patient    Lynn Moses R Lynn Moses 12/19/2020, 8:57 AM

## 2020-12-20 ENCOUNTER — Other Ambulatory Visit (HOSPITAL_COMMUNITY): Payer: Self-pay | Admitting: Obstetrics and Gynecology

## 2020-12-20 ENCOUNTER — Encounter: Payer: Self-pay | Admitting: Obstetrics and Gynecology

## 2020-12-20 DIAGNOSIS — Z9889 Other specified postprocedural states: Secondary | ICD-10-CM

## 2020-12-20 MED ORDER — FERROUS SULFATE 325 (65 FE) MG PO TABS: 325.0000 mg | ORAL_TABLET | ORAL | 3 refills | Status: DC

## 2020-12-20 MED ORDER — ACETAMINOPHEN 325 MG PO TABS: 650.0000 mg | ORAL_TABLET | Freq: Four times a day (QID) | ORAL | Status: DC | PRN

## 2020-12-20 MED ORDER — IBUPROFEN 600 MG PO TABS: 600.0000 mg | ORAL_TABLET | Freq: Three times a day (TID) | ORAL | 0 refills | Status: DC | PRN

## 2020-12-20 MED ORDER — COCONUT OIL OIL: 1.0000 "application " | TOPICAL_OIL | 0 refills | Status: DC | PRN

## 2020-12-20 NOTE — Lactation Note (Signed)
This note was copied from a baby's chart. Lactation Consultation Note  Patient Name: Lynn Moses [id number] Date: 12/20/2020 Reason for consult: Follow-up assessment Age:23 hours  Follow up visit to 34 hours old with 4.86% weight loss. Mother states breastfeeding is going well. Mother requested assistance with hand expression. Taught breast massage and hand expression to both parents. Colostrum is easily expressed.   Feeding plan:  1. Breastfeed following hunger cues.  2. Stimulate infant awake at the breast 3. Offer breast 8 -  12 times in 24h period to establish good milk supply.   4. If needed supplement with formula following guidelines, pace bottle feeding and fullness cues.   5. Encouraged maternal rest, hydration and food intake.  6. Contact Lactation Services or local resources for support, questions or concerns.    All questions answered at this time. Family is waiting to be discharged home today.   Maternal Data Has patient been taught Hand Expression?: Yes  Feeding Feeding Type: Breast Fed  Interventions Interventions: Breast feeding basics reviewed;Expressed milk;Breast massage;Hand express;DEBP  Consult Status Consult Status: Complete Date: 12/20/20 Follow-up type: Call as needed    Lynn Moses 12/20/2020, 11:13 AM

## 2020-12-22 ENCOUNTER — Telehealth: Payer: Self-pay | Admitting: Obstetrics and Gynecology

## 2020-12-22 DIAGNOSIS — O99019 Anemia complicating pregnancy, unspecified trimester: Secondary | ICD-10-CM

## 2020-12-22 MED ORDER — FERROUS SULFATE 324 (65 FE) MG PO TBEC
1.0000 | DELAYED_RELEASE_TABLET | ORAL | 1 refills | Status: DC
Start: 2020-12-22 — End: 2022-03-10

## 2020-12-22 MED FILL — IBUPROFEN 600 MG TABLET: 600 | 10 days supply | Qty: 30 | Fill #0

## 2020-12-22 NOTE — Telephone Encounter (Signed)
Per nursing, received call from pt recently discharged from postpartum unit. Preferred pharmacy did not have iron ready for pickup. Re-sent to preferred pharmacy.  Sheila Oats, MD OB Fellow, [facility] 12/22/2020 3:22 PM

## 2020-12-23 ENCOUNTER — Ambulatory Visit (INDEPENDENT_AMBULATORY_CARE_PROVIDER_SITE_OTHER): Payer: BLUE CROSS/BLUE SHIELD | Admitting: Obstetrics & Gynecology

## 2020-12-23 ENCOUNTER — Encounter: Payer: Self-pay | Admitting: Obstetrics & Gynecology

## 2020-12-23 ENCOUNTER — Other Ambulatory Visit: Payer: Self-pay

## 2020-12-23 DIAGNOSIS — O901 Disruption of perineal obstetric wound: Secondary | ICD-10-CM

## 2020-12-23 MED ORDER — LIDOCAINE HCL URETHRAL/MUCOSAL 2 % EX GEL: 1.0000 "application " | CUTANEOUS | 0 refills | Status: DC | PRN

## 2020-12-23 NOTE — Progress Notes (Signed)
Patient delivered four days ago but has noticed a sore bump in her vaginal area.Patient does report vacuum assisted vaginal delivery with 2nd degree tear.  Patient is breastfeeding. Armandina Stammer RN

## 2020-12-23 NOTE — Progress Notes (Signed)
GYNECOLOGY  VISIT  CC:   Vulvar pain, possible lesion  HPI: 24 y.o. G1P1001 Single Other or two or more races female here for complaint of seeing possible abnormal lesion on vulva and having pain.  It is painful to sit or walk at times.  Heat has helped.  Denies fever or abnormal discharge.  Having normal lochia.  Delivery via vaginal delivery on 12/24 and had second degree laceration.  Feels cool packs, warm water, and neosporin has helped.  Patient Active Problem List   Diagnosis Date Noted  . History of depression 06/12/2020  . 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 Anxiety   . Depression 2020   pt has stopped taking her Zoloft when she was trying to get pregnant  . Ovarian cyst 01/2020    Past Surgical History:  Procedure Laterality Date  . NO PAST SURGERIES      MEDS:   Current Outpatient Medications on File Prior to Visit  Medication Sig Dispense Refill  . ferrous sulfate 324 (65 Fe) MG TBEC Take 1 tablet (325 mg total) by mouth every other day. 30 tablet 1  . ibuprofen (ADVIL) 600 MG tablet Take 1 tablet (600 mg total) by mouth every 8 (eight) hours as needed for moderate pain or cramping. 30 tablet 0  . prenatal vitamin w/FE, FA (PRENATAL 1 + 1) 27-1 MG TABS tablet Take 1 tablet by mouth daily at 12 noon.    Marland Kitchen acetaminophen (TYLENOL) 325 MG tablet Take 2 tablets (650 mg total) by mouth every 6 (six) hours as needed for mild pain, moderate pain, fever or headache (for pain scale < 4).    . coconut oil OIL Apply 1 application topically as needed (nipple pain).  0   No current facility-administered medications on file prior to visit.    ALLERGIES: Patient has no known allergies.  Family History  Problem Relation Age of Onset  . Hypertension Maternal Grandmother   . Cancer Paternal Grandmother   . Varicose Veins Mother     SH:  Single, non smoker  Review of Systems  Constitutional: Negative.    Gastrointestinal: Negative for constipation and diarrhea.  Genitourinary: Negative for dysuria and pelvic pain.    PHYSICAL EXAMINATION:    BP 112/70   Pulse 75   Wt 176 lb (79.8 kg)   Breastfeeding Yes   BMI 28.41 kg/m     General appearance: alert, cooperative and appears stated age Lymph:  no inguinal LAD noted  Pelvic: External genitalia:  no lesions but superficial dehiscence of repair from ob laceration, normal early granulation tissue, no abnormal drainage, no surrounding erythema              Urethra:  normal appearing urethra with no masses, tenderness or lesions              Bartholins and Skenes: normal                 Anus:  no lesions  Chaperone, Armandina Stammer, RN, was present for exam.  Assessment/Plan: 1. Obstetrical laceration, second degree, with superficial dehiscence, otherwise healing well - Pt reassured about physical exam findings.  Perineal care, heat and neosporin discussed.  Also discussed topical use of lidocaine for pain control while healing is occurring. - Follow up for post partum appt that is already scheduled - lidocaine (XYLOCAINE) 2 % jelly; Apply 1 application topically every 4 (four) hours as needed.  Dispense:  30 mL; Refill: 0

## 2020-12-30 IMAGING — US US MFM OB FOLLOW-UP
1 series · 14 of 28 positions shown · non-contrast
Comparison: none

[Series 1: us mfm ob follow-up · 14 of 79 slices shown]
[im 3/79]
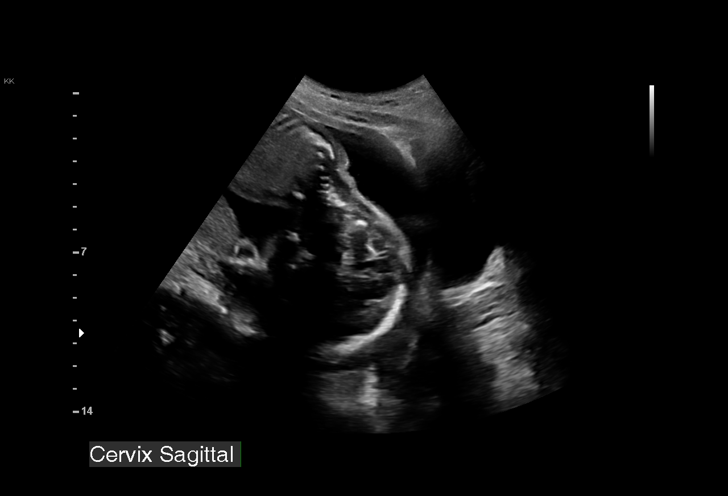
[im 9/79]
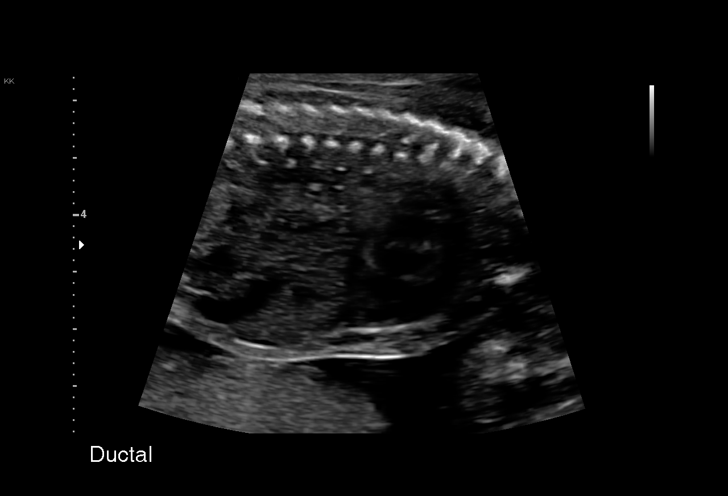
[im 15/79]
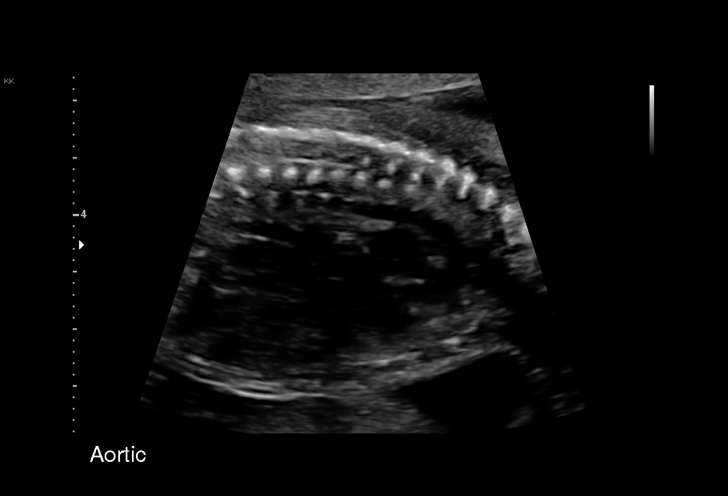
[im 21/79]
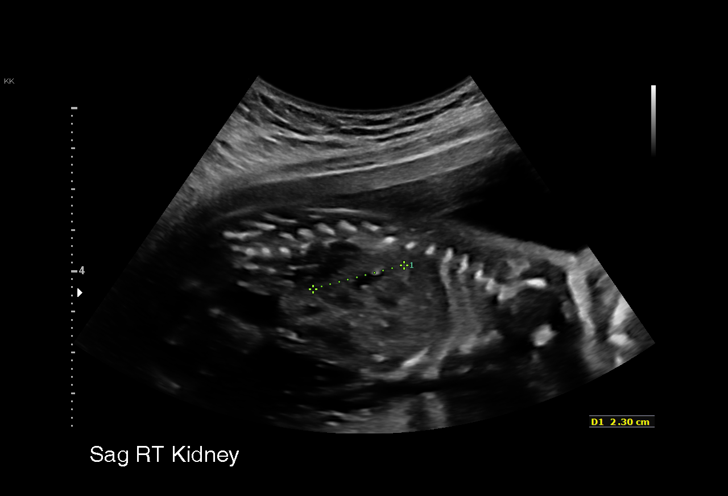
[im 27/79]
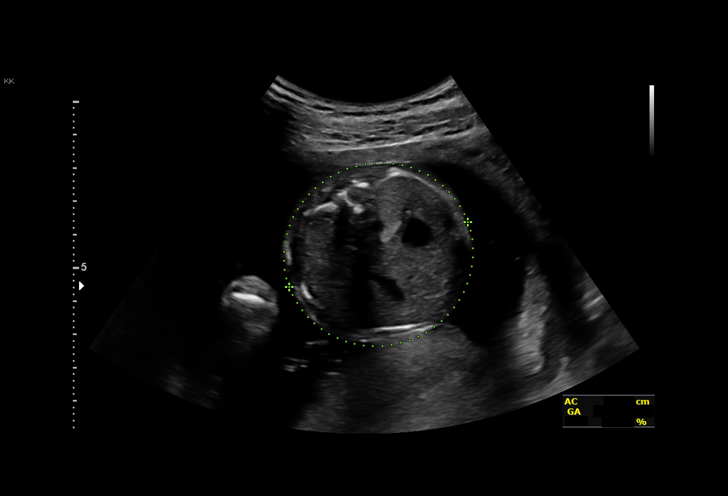
[im 32/79]
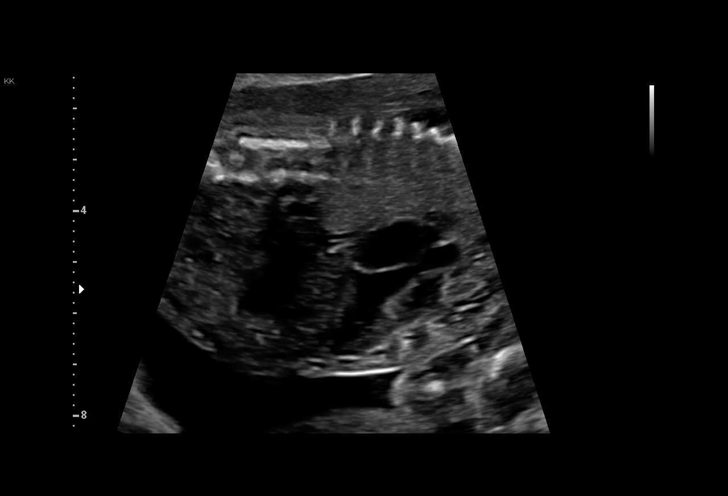
[im 38/79]
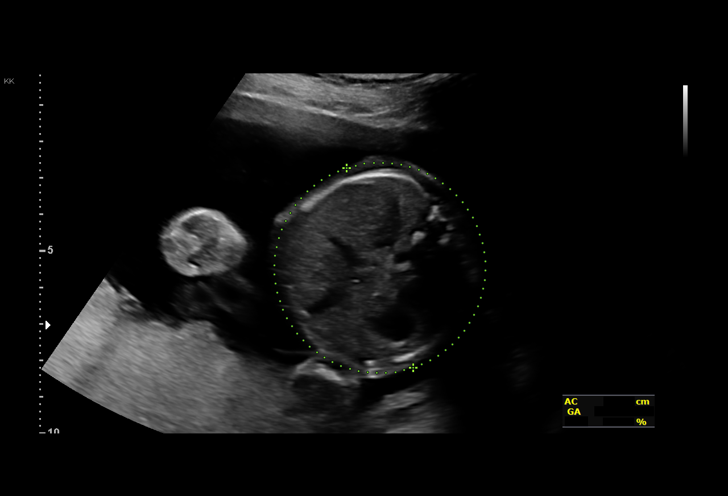
[im 44/79]
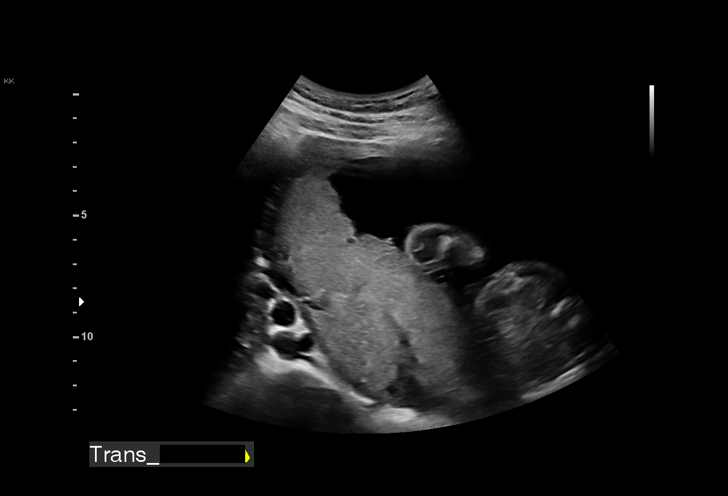
[im 50/79]
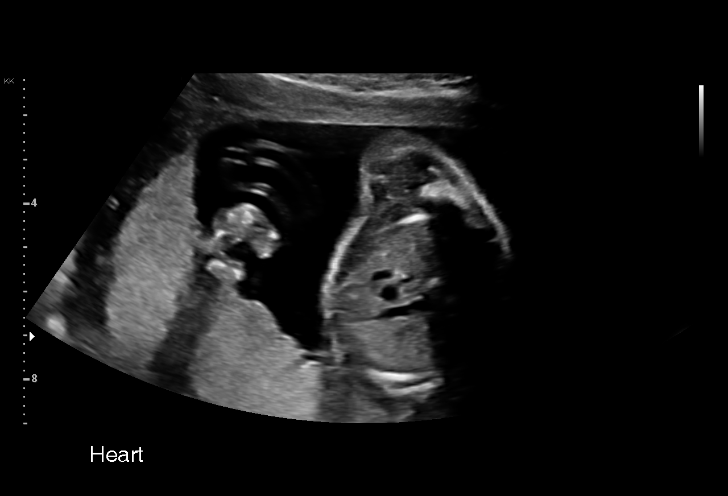
[im 55/79]
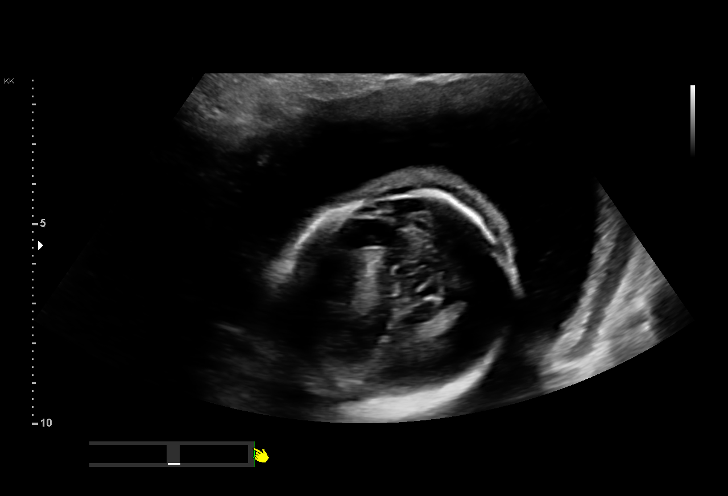
[im 61/79]
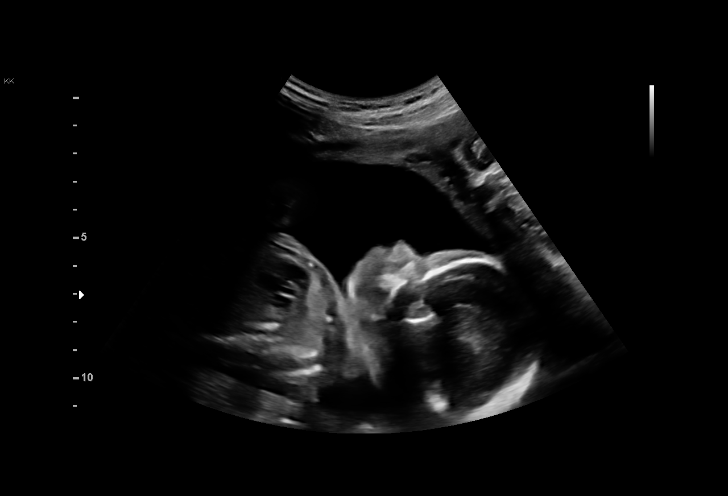
[im 67/79]
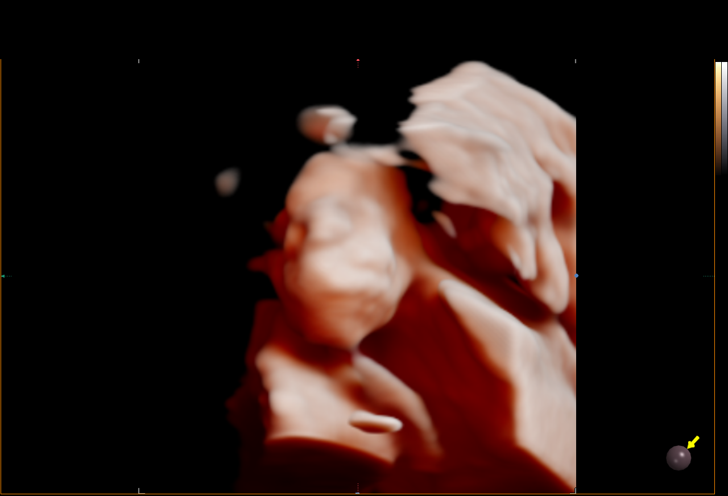
[im 73/79]
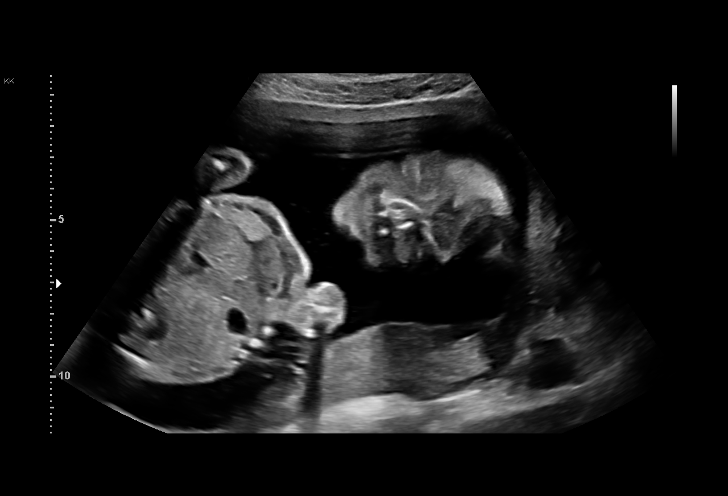
[im 79/79]
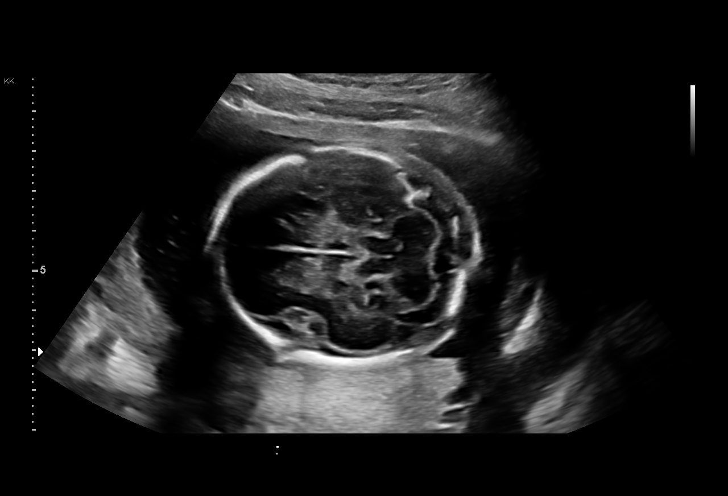

[14 of 28 positions shown; findings below may reference images not displayed]

NECO

Indications

 Encounter for other antenatal screening
 follow-up
 23 weeks gestation of pregnancy
 Other mental disorder complicating
 pregnancy, second trimester (Depression)
 Quad Negative
Fetal Evaluation

 Num Of Fetuses:         1
 Fetal Heart Rate(bpm):  157
 Cardiac Activity:       Observed
 Presentation:           Cephalic
 Placenta:               Fundal Right
 P. Cord Insertion:      Previously Visualized

 Amniotic Fluid
 AFI FV:      Within normal limits

                             Largest Pocket(cm)

Biometry

 BPD:        55  mm     G. Age:  22w 5d         31  %    CI:        76.65   %    70 - 86
                                                         FL/HC:      20.5   %    19.2 -
 HC:       199   mm     G. Age:  22w 0d          6  %    HC/AC:      1.12        1.05 -
 AC:      177.9  mm     G. Age:  22w 5d         27  %    FL/BPD:     74.2   %    71 - 87
 FL:       40.8  mm     G. Age:  23w 2d         41  %    FL/AC:      22.9   %    20 - 24

 Est. FW:     538  gm      1 lb 3 oz     28  %
OB History

 Blood Type:   A+
 Gravidity:    1         Term:   0        Prem:   0        SAB:   0
 TOP:          0       Ectopic:  0        Living: 0
Gestational Age

 LMP:           23w 1d        Date:  03/13/20                 EDD:   12/18/20
 U/S Today:     22w 5d                                        EDD:   12/21/20
 Best:          23w 1d     Det. By:  LMP  (03/13/20)          EDD:   12/18/20
Anatomy

 Cranium:               Appears normal         Aortic Arch:            Appears normal
 Cavum:                 Appears normal         Ductal Arch:            Appears normal
 Ventricles:            Appears normal         Diaphragm:              Appears normal
 Choroid Plexus:        Previously seen        Stomach:                Appears normal, left
                                                                       sided
 Cerebellum:            Appears normal         Abdomen:                Appears normal
 Posterior Fossa:       Appears normal         Abdominal Wall:         Previously seen
 Nuchal Fold:           Previously seen        Cord Vessels:           Previously seen
 Face:                  Appears normal         Kidneys:                Appear normal
                        (orbits and profile)
 Lips:                  Appears normal         Bladder:                Appears normal
 Thoracic:              Appears normal         Spine:                  Previously seen
 Heart:                 Appears normal         Upper Extremities:      Previously seen
                        (4CH, axis, and
                        situs)
 RVOT:                  Previously seen        Lower Extremities:      Previously seen
 LVOT:                  Previously seen

 Other:  Fetus appears to be a male. Heels, 5th digit, and Open hands
         previously visualized. Technically difficult due to fetal position.
Cervix Uterus Adnexa

 Cervix
 Length:           2.97  cm.
 Normal appearance by transabdominal scan.
Impression

 Follow up growth to confirm dates
 Normal interval growth with measurements consistent with
 dates
 Good fetal movement and amniotic fluid volume.
 Low risk quad
 Anatomy compete today.
Recommendations

 Follow up growth as clinically indicated.

## 2021-01-21 ENCOUNTER — Encounter: Payer: Self-pay | Admitting: Obstetrics & Gynecology

## 2021-01-21 ENCOUNTER — Other Ambulatory Visit (HOSPITAL_COMMUNITY)
Admission: RE | Admit: 2021-01-21 | Discharge: 2021-01-21 | Disposition: A | Discharge status: Home / Self Care | Payer: BLUE CROSS/BLUE SHIELD | Source: Ambulatory Visit | Attending: Obstetrics & Gynecology | Admitting: Obstetrics & Gynecology

## 2021-01-21 ENCOUNTER — Other Ambulatory Visit: Payer: Self-pay

## 2021-01-21 ENCOUNTER — Ambulatory Visit (INDEPENDENT_AMBULATORY_CARE_PROVIDER_SITE_OTHER): Payer: BLUE CROSS/BLUE SHIELD | Admitting: Obstetrics & Gynecology

## 2021-01-21 DIAGNOSIS — Z124 Encounter for screening for malignant neoplasm of cervix: Secondary | ICD-10-CM

## 2021-01-21 NOTE — Progress Notes (Signed)
    Post Partum Visit Note  Lynn Moses is a 25 y.o. G47P1001 female who presents for a postpartum visit. She is 5 weeks postpartum following a vacuum-assisted vaginal delivery.  I have fully reviewed the prenatal and intrapartum course. The delivery was at 40 gestational weeks.  Anesthesia: epidural. Postpartum course has been uneventful. Baby is doing well- does have umbilical hernia. Baby is feeding by breast. Bleeding no bleeding. Bowel function is normal. Bladder function is normal. Patient is not sexually active. Contraception method is undecided between POP or depo. Postpartum depression screening:postive score 13  The pregnancy intention screening data noted above was reviewed. Potential methods of contraception were discussed. She is undecided about contraception.  The following portions of the patient's history were reviewed and updated as appropriate: allergies, current medications, past family history, past medical history, past social history, past surgical history and problem list.  Review of Systems Pertinent items noted in HPI and remainder of comprehensive ROS otherwise negative.    Objective:  There were no vitals taken for this visit.   General:  alert and no distress   Breasts:  inspection negative, no nipple discharge or bleeding, no masses or nodularity palpable  Lungs: clear to auscultation bilaterally  Heart:  regular rate and rhythm, S1, S2 normal, no murmur, click, rub or gallop  Abdomen: soft, non-tender; bowel sounds normal; no masses,  no organomegaly   Vulva:  normal  Vagina: normal vagina, no discharge, exudate, lesion, or erythema  Cervix:  no lesions  Corpus: normal size, contour, position, consistency, mobility, non-tender  Adnexa:  normal adnexa and no mass, fullness, tenderness  Rectal Exam: Not performed.        Assessment:    Normal postpartum exam. Pap smear done at today's visit.   Plan:   Essential components of care per ACOG  recommendations:  1.  Mood and well being: Patient with positive depression screening today. Reviewed local resources for support.  Referral offered.  She has a therapist that is located in [place].  Declines referral to anyone else today.  She is still in school and juggling being a parent and school - Patient does not use tobacco. - hx of drug use? No   2. Infant care and feeding:  -Patient currently breastmilk feeding? Yes .  She is pumping and she can feed breast milk with a bottle. -Social determinants of health (SDOH) reviewed in EPIC. No concerns  3. Sexuality, contraception and birth spacing - Patient does not want a pregnancy in the next year.  Desired family size is 3 children.  - Reviewed forms of contraception in tiered fashion. She is thinking about a progesterone method--POP or Depo provera.  She will call and let [place] know - Discussed birth spacing of 18 months  4. Sleep and fatigue -Encouraged family/partner/community support of 4 hrs of uninterrupted sleep to help with mood and fatigue  5. Physical Recovery  - Discussed patients delivery and complications - Patient had a 2nd degree laceration, perineal healing reviewed. Patient expressed understanding - Patient has urinary incontinence? No - Patient is safe to resume physical and sexual activity  6.  Health Maintenance - Do not have pap in records.  Pap obtained today.  7. No chronic Disease  M. Leda Quail, MD Center for [facility], [facility]

## 2021-01-22 ENCOUNTER — Ambulatory Visit: Payer: BLUE CROSS/BLUE SHIELD | Admitting: Family Medicine

## 2021-01-22 LAB — CYTOLOGY - PAP: Diagnosis: NEGATIVE

## 2021-01-28 ENCOUNTER — Other Ambulatory Visit: Payer: Self-pay

## 2021-01-28 MED ORDER — NORETHINDRONE 0.35 MG PO TABS: 1.0000 | ORAL_TABLET | Freq: Every day | ORAL | 11 refills | Status: DC

## 2021-01-28 NOTE — Telephone Encounter (Signed)
Spoke with Dr. Adrian Blackwater in regards to giving the patient the progestrone only pill. Patient was just seen for her postpartum exam. Patient is breastfeeding.  Made patient aware to take at the same time every for maximum effectiveness. Armandina Stammer RN

## 2021-07-05 ENCOUNTER — Telehealth (HOSPITAL_COMMUNITY): Payer: Self-pay

## 2021-07-05 NOTE — Telephone Encounter (Signed)
Returned call to mom, log states low milk supply, baby 44mo. Call placed, person answered phone but hung up. Lactation to f/u. BGilliam, RN, IBCLC

## 2021-07-22 ENCOUNTER — Other Ambulatory Visit: Payer: Self-pay | Admitting: Internal Medicine

## 2021-07-22 DIAGNOSIS — Z8543 Personal history of malignant neoplasm of ovary: Secondary | ICD-10-CM

## 2021-07-22 DIAGNOSIS — N930 Postcoital and contact bleeding: Secondary | ICD-10-CM

## 2021-07-23 ENCOUNTER — Encounter: Payer: Self-pay | Admitting: Obstetrics and Gynecology

## 2021-07-23 ENCOUNTER — Other Ambulatory Visit (HOSPITAL_COMMUNITY)
Admission: RE | Admit: 2021-07-23 | Discharge: 2021-07-23 | Disposition: A | Discharge status: Home / Self Care | Payer: BLUE CROSS/BLUE SHIELD | Source: Ambulatory Visit | Attending: Obstetrics and Gynecology | Admitting: Obstetrics and Gynecology

## 2021-07-23 ENCOUNTER — Other Ambulatory Visit: Payer: Self-pay

## 2021-07-23 ENCOUNTER — Ambulatory Visit: Payer: BLUE CROSS/BLUE SHIELD | Admitting: Obstetrics and Gynecology

## 2021-07-23 VITALS — BP 106/70 | HR 65 | Wt 174.0 lb

## 2021-07-23 DIAGNOSIS — N761 Subacute and chronic vaginitis: Secondary | ICD-10-CM | POA: Insufficient documentation

## 2021-07-23 NOTE — Progress Notes (Signed)
25 yo P1 presenting today for the evaluation of postcoital vaginal bleeding. Patient reports that this has been on going for the past 2 months. She describes it as a few days of vaginal spotting following intercourse. Initially she thought it was the result of inadequate lubrication but even with good lubrication same bleeding pattern persists. Patient is currently breastfeeding her 56 month old and has not had a regular period yet. She reports 3-4 days of light bleeding occasionally. Patient denies pelvic pain or abnormal discharge  Past Medical History:  Diagnosis Date   Anxiety    Depression 2020   pt has stopped taking her Zoloft when she was trying to get pregnant   Ovarian cyst 01/2020   Past Surgical History:  Procedure Laterality Date   NO PAST SURGERIES     Family History  Problem Relation Age of Onset   Hypertension Maternal Grandmother    Cancer Paternal Grandmother    Varicose Veins Mother    Social History   Tobacco Use   Smoking status: Never   Smokeless tobacco: Never  Vaping Use   Vaping Use: Never used  Substance Use Topics   Alcohol use: Not Currently   Drug use: Never   ROS See pertinent in HPI. All other systems reviewed and non contributory Blood pressure 106/70, pulse 65, weight 174 lb (78.9 kg), last menstrual period 07/12/2021, currently breastfeeding.  GENERAL: Well-developed, well-nourished female in no acute distress.  ABDOMEN: Soft, nontender, nondistended. No organomegaly. PELVIC: Normal external female genitalia. Vagina is pink and rugated.  Normal discharge. Normal appearing cervix. Uterus is normal in size.  No adnexal mass or tenderness. EXTREMITIES: No cyanosis, clubbing, or edema, 2+ distal pulses.  A/P 25 yo with postcoital vaginal bleeding - Vaginal swab collected to rule out infectious process - Patient will be contacted with abnormal results - Patient with normal pap smear 12/2020 - RTC prn

## 2021-07-24 LAB — CERVICOVAGINAL ANCILLARY ONLY
Bacterial Vaginitis (gardnerella): NEGATIVE
Candida Glabrata: NEGATIVE
Candida Vaginitis: NEGATIVE
Chlamydia: NEGATIVE
Comment: NEGATIVE
Comment: NEGATIVE
Comment: NEGATIVE
Comment: NEGATIVE
Comment: NEGATIVE
Comment: NORMAL
Neisseria Gonorrhea: NEGATIVE
Trichomonas: NEGATIVE

## 2021-07-31 ENCOUNTER — Other Ambulatory Visit: Payer: BLUE CROSS/BLUE SHIELD

## 2021-12-19 ENCOUNTER — Other Ambulatory Visit: Payer: Self-pay

## 2021-12-19 ENCOUNTER — Ambulatory Visit
Admission: EM | Admit: 2021-12-19 | Discharge: 2021-12-19 | Discharge status: Absent without leave | Payer: BLUE CROSS/BLUE SHIELD

## 2021-12-21 ENCOUNTER — Encounter: Payer: Self-pay | Admitting: Emergency Medicine

## 2021-12-21 ENCOUNTER — Other Ambulatory Visit: Payer: Self-pay

## 2021-12-21 ENCOUNTER — Ambulatory Visit
Admission: EM | Admit: 2021-12-21 | Discharge: 2021-12-21 | Disposition: A | Discharge status: Home / Self Care | Payer: BLUE CROSS/BLUE SHIELD | Attending: Internal Medicine | Admitting: Internal Medicine

## 2021-12-21 DIAGNOSIS — R300 Dysuria: Secondary | ICD-10-CM | POA: Diagnosis present

## 2021-12-21 DIAGNOSIS — N898 Other specified noninflammatory disorders of vagina: Secondary | ICD-10-CM | POA: Insufficient documentation

## 2021-12-21 DIAGNOSIS — N3000 Acute cystitis without hematuria: Secondary | ICD-10-CM | POA: Diagnosis not present

## 2021-12-21 LAB — POCT URINE PREGNANCY: Preg Test, Ur: NEGATIVE

## 2021-12-21 LAB — POCT URINALYSIS DIP (MANUAL ENTRY)
Bilirubin, UA: NEGATIVE
Blood, UA: NEGATIVE
Glucose, UA: NEGATIVE mg/dL
Ketones, POC UA: NEGATIVE mg/dL
Nitrite, UA: NEGATIVE
Protein Ur, POC: 30 mg/dL — AB
Spec Grav, UA: 1.025 (ref 1.010–1.025)
Urobilinogen, UA: 0.2 E.U./dL
pH, UA: 7.5 (ref 5.0–8.0)

## 2021-12-21 MED ORDER — FLUCONAZOLE 150 MG PO TABS: 150.0000 mg | ORAL_TABLET | ORAL | 0 refills | Status: DC

## 2021-12-21 MED ORDER — NITROFURANTOIN MONOHYD MACRO 100 MG PO CAPS: 100.0000 mg | ORAL_CAPSULE | Freq: Two times a day (BID) | ORAL | 0 refills | Status: DC

## 2021-12-21 NOTE — Discharge Instructions (Addendum)
It appears that you have a urinary tract infection.  This is being treated with Macrobid antibiotic.  You may also have a vaginal yeast infection.  This is being treated with Diflucan.  Take 1 Diflucan pill today.  You may take a second pill in 3 days if no resolution of symptoms with the first pill.  Your urine culture and vaginal swab are pending.  We will call and change treatment or add treatment if appropriate.  Please refrain from sexual activity until test results and treatment are complete.

## 2021-12-21 NOTE — ED Triage Notes (Signed)
2 days ago noticed burning with urination.  Creamy, clumpy vaginal discharge, burning and itching complaints as well

## 2021-12-21 NOTE — ED Provider Notes (Signed)
[facility]    CSN: [id number] Arrival date & time: 12/21/21  7616      History   Chief Complaint Chief Complaint  Patient presents with   Urinary Tract Infection    HPI Lynn Moses is a 25 y.o. female.   Patient presents with 2-day history of urinary burning, urinary frequency, vaginal discharge.  Patient reports that vaginal discharge is white and clumpy.  She has also has some associated vaginal itchiness.  Denies hematuria, irregular vaginal bleeding, pelvic pain, fever, back pain, abdominal pain.  Denies any known exposure to STD but has had unprotected sexual intercourse with monogamous partner recently.   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 Authorizing Provider  fluconazole (DIFLUCAN) 150 MG tablet Take 1 tablet (150 mg total) by mouth every 3 (three) days. Take first pill today.  May take second pill in 3 days if no resolution of symptoms with first pill. 12/21/21  Yes Kamyra Schroeck, Acie Fredrickson, FNP  Homeopathic Products (AZO YEAST PLUS PO) Take by mouth.   Yes [provider]  nitrofurantoin, macrocrystal-monohydrate, (MACROBID) 100 MG capsule Take 1 capsule (100 mg total) by mouth 2 (two) times daily. 12/21/21  Yes Kellie Murrill, Acie Fredrickson, FNP  acetaminophen (TYLENOL) 325 MG tablet Take 2 tablets (650 mg total)  by mouth every 6 (six) hours as needed for mild pain, moderate pain, fever or headache (for pain scale < 4). Patient not taking: Reported on 07/23/2021 12/20/20   Sheila Oats, MD  buPROPion [place] SR) 150 MG 12 hr tablet 1 tablet in the morning    [provider]  coconut oil OIL Apply 1 application topically as needed (nipple pain). Patient not taking: Reported on 07/23/2021 12/20/20   Sheila Oats, MD  ferrous sulfate 324 (65 Fe) MG TBEC Take 1 tablet (325 mg total) by mouth every other day. Patient not taking: Reported on 07/23/2021 12/22/20   Sheila Oats, MD  lidocaine (XYLOCAINE) 2 % jelly Apply 1 application topically every 4 (four) hours as needed. Patient not taking: Reported on 07/23/2021 12/23/20   Jerene Bears, MD  norethindrone (MICRONOR) 0.35 MG tablet Take 1 tablet (0.35 mg total) by mouth daily. Patient not taking: Reported on 07/23/2021 01/28/21   Levie Heritage, DO  prenatal vitamin w/FE, FA (PRENATAL 1 + 1) 27-1 MG TABS tablet Take 1 tablet by mouth daily at 12 noon.    [provider]    Family History Family History  Problem  Relation Age of Onset   Hypertension Maternal Grandmother    Cancer Paternal Grandmother    Varicose Veins Mother     Social History Social History   Tobacco Use   Smoking status: Never   Smokeless tobacco: Never  Vaping Use   Vaping Use: Never used  Substance Use Topics   Alcohol use: Yes   Drug use: Never     Allergies   Patient has no known allergies.   Review of Systems Review of Systems Per HPI  Physical Exam Triage Vital Signs ED Triage Vitals  Enc Vitals Group     BP 12/21/21 0855 107/67     Pulse Rate 12/21/21 0855 79     Resp 12/21/21 0855 18     Temp 12/21/21 0855 98.7 F (37.1 C)     Temp Source 12/21/21 0855 Oral     SpO2 12/21/21 0855 96 %     Weight --      Height --      Head Circumference --      Peak Flow --      Pain Score 12/21/21 0852 7     Pain Loc --      Pain  Edu? --      Excl. in [place]? --    No data found.  Updated Vital Signs BP 107/67 (BP Location: Left Arm)    Pulse 79    Temp 98.7 F (37.1 C) (Oral)    Resp 18    LMP 12/01/2021    SpO2 96%   Visual Acuity Right Eye Distance:   Left Eye Distance:   Bilateral Distance:    Right Eye Near:   Left Eye Near:    Bilateral Near:     Physical Exam Constitutional:      General: She is not in acute distress.    Appearance: Normal appearance. She is not toxic-appearing or diaphoretic.  HENT:     Head: Normocephalic and atraumatic.  Eyes:     Extraocular Movements: Extraocular movements intact.     Conjunctiva/sclera: Conjunctivae normal.  Cardiovascular:     Rate and Rhythm: Normal rate and regular rhythm.     Pulses: Normal pulses.     Heart sounds: Normal heart sounds.  Pulmonary:     Effort: Pulmonary effort is normal. No respiratory distress.     Breath sounds: Normal breath sounds.  Abdominal:     General: Abdomen is flat. Bowel sounds are normal. There is no distension.     Tenderness: There is no abdominal tenderness.  Genitourinary:    Comments: Deferred with shared decision making.  Self swab performed. Neurological:     General: No focal deficit present.     Mental Status: She is alert and oriented to person, place, and time. Mental status is at baseline.  Psychiatric:        Mood and Affect: Mood normal.        Behavior: Behavior normal.        Thought Content: Thought content normal.        Judgment: Judgment normal.     UC Treatments / Results  Labs (all labs ordered are listed, but only abnormal results are displayed) Labs Reviewed  POCT URINALYSIS DIP (MANUAL ENTRY) - Abnormal; Notable for the following components:      Result Value   Clarity, UA hazy (*)    Protein Ur, POC =30 (*)    Leukocytes, UA Large (3+) (*)    All other components within  normal limits  URINE CULTURE  POCT URINE PREGNANCY  CERVICOVAGINAL ANCILLARY ONLY    EKG   Radiology No  results found.  Procedures Procedures (including critical care time)  Medications Ordered in UC Medications - No data to display  Initial Impression / Assessment and Plan / UC Course  I have reviewed the triage vital signs and the nursing notes.  Pertinent labs & imaging results that were available during my care of the patient were reviewed by me and considered in my medical decision making (see chart for details).     Urinalysis showing large amount of leukocytes which can indicate urinary tract infection.  Will treat with Macrobid x5 days.  Urine culture and cervicovaginal swab pending.  Vaginal discharge is consistent with vaginal yeast infection.  Will treat with Diflucan.  Will await urine culture and vaginal swab results and change treatment if appropriate.  Patient to refrain from sexual activity until test results and treatment are complete.  Discussed strict return precautions.  Patient verbalized understanding and was agreeable with plan. Final Clinical Impressions(s) / UC Diagnoses   Final diagnoses:  Acute cystitis without hematuria  Dysuria  Vaginal discharge     Discharge Instructions      It appears that you have a urinary tract infection.  This is being treated with Macrobid antibiotic.  You may also have a vaginal yeast infection.  This is being treated with Diflucan.  Take 1 Diflucan pill today.  You may take a second pill in 3 days if no resolution of symptoms with the first pill.  Your urine culture and vaginal swab are pending.  We will call and change treatment or add treatment if appropriate.  Please refrain from sexual activity until test results and treatment are complete.    ED Prescriptions     Medication Sig Dispense Auth. Provider   nitrofurantoin, macrocrystal-monohydrate, (MACROBID) 100 MG capsule Take 1 capsule (100 mg total) by mouth 2 (two) times daily. 10 capsule Oswaldo Conroy E, [place]   fluconazole (DIFLUCAN) 150 MG tablet Take 1 tablet (150 mg  total) by mouth every 3 (three) days. Take first pill today.  May take second pill in 3 days if no resolution of symptoms with first pill. 2 tablet [place], Michele Rockers, [place]      PDMP not reviewed this encounter.   Teodora Medici, [place] 12/21/21 0930

## 2021-12-23 LAB — CERVICOVAGINAL ANCILLARY ONLY
Bacterial Vaginitis (gardnerella): NEGATIVE
Candida Glabrata: NEGATIVE
Candida Vaginitis: POSITIVE — AB
Chlamydia: NEGATIVE
Comment: NEGATIVE
Comment: NEGATIVE
Comment: NEGATIVE
Comment: NEGATIVE
Comment: NEGATIVE
Comment: NORMAL
Neisseria Gonorrhea: NEGATIVE
Trichomonas: NEGATIVE

## 2021-12-23 LAB — URINE CULTURE: Culture: <10000 — AB

## 2021-12-27 NOTE — L&D Delivery Note (Addendum)
Patient: Lynn Moses MRN: [id number]  GBS status: negative  Patient is a 26 y.o. now G2P2 s/p NSVD at [redacted]w[redacted]d, who was admitted for labor. SROM 3h 19m prior to delivery with particulate-containing fluid. Meconium stained fluid followed (200cc) at delivery.    Delivery Note At 2:48 PM a viable female was delivered via Vaginal, Spontaneous (Presentation: Left Occiput Anterior).  APGAR: 7, 9; weight  pending.   Placenta status: Spontaneous, Intact.  Cord: 3 vessels with the following complications: None.    Head delivered LOA. No nuchal cord present. Shoulder and body delivered in usual fashion. Infant with spontaneous cry, placed on mother's abdomen, dried and bulb suctioned. Cord clamped x 2 after 1-minute delay, and cut by family member. Cord blood drawn. Placenta delivered spontaneously with gentle cord traction. Fundus firm with massage and Pitocin. Perineum inspected and found to have 1st degree perineal laceration, which was repaired with 3-0 vicryl with good hemostasis achieved.  Anesthesia: Epidural Episiotomy: None Lacerations: 1st degree Suture Repair: 3.0 vicryl Est. Blood Loss (mL): 37  Mom to postpartum.  Baby to Couplet care / Skin to Skin.  Linzie Collin 11/09/2022, 3:05 PM   Fellow ATTESTATION  I was present and gloved for this delivery and agree with the above documentation in the resident's note except as below.   Celedonio Savage, MD [facility] (Faculty Practice) 11/09/2022, 3:08 PM

## 2021-12-29 ENCOUNTER — Ambulatory Visit (INDEPENDENT_AMBULATORY_CARE_PROVIDER_SITE_OTHER): Payer: BLUE CROSS/BLUE SHIELD | Admitting: Advanced Practice Midwife

## 2021-12-29 ENCOUNTER — Encounter: Payer: Self-pay | Admitting: Advanced Practice Midwife

## 2021-12-29 ENCOUNTER — Other Ambulatory Visit: Payer: Self-pay

## 2021-12-29 VITALS — BP 121/65 | HR 73 | Ht 66.0 in | Wt 173.0 lb

## 2021-12-29 DIAGNOSIS — Z01419 Encounter for gynecological examination (general) (routine) without abnormal findings: Secondary | ICD-10-CM

## 2021-12-29 NOTE — Progress Notes (Signed)
GYNECOLOGY ANNUAL PREVENTATIVE CARE ENCOUNTER NOTE  Subjective:   Lynn Moses is a 26 y.o. G13P1001 female here for a routine annual gynecologic exam.  Current complaints: none.  Treated for yeast infection last week. Denies abnormal vaginal bleeding, discharge, pelvic pain, problems with intercourse or other gynecologic concerns.    Gynecologic History Patient's last menstrual period was 12/01/2021 (exact date). Contraception: oral progesterone-only contraceptive Last Pap: 01/21/21. Results were: normal   Obstetric History OB History  Gravida Para Term Preterm AB Living  1 1 1     1   SAB IAB Ectopic Multiple Live Births        0 1    # Outcome Date GA Lbr Len/2nd Weight Sex Delivery Anes PTL Lv  1 Term 12/19/20 [redacted]w[redacted]d 02:42 / 00:14 6 lb 1.9 oz (2.775 kg) M Vag-Vacuum EPI  LIV     Birth Comments: WDL    Past Medical History:  Diagnosis Date   Anxiety    Depression 2020   pt has stopped taking her Zoloft when she was trying to get pregnant   Ovarian cyst 01/2020    Past Surgical History:  Procedure Laterality Date   NO PAST SURGERIES      Current Outpatient Medications on File Prior to Visit  Medication Sig Dispense Refill   buPROPion (WELLBUTRIN SR) 150 MG 12 hr tablet 1 tablet in the morning     fluconazole (DIFLUCAN) 150 MG tablet Take 1 tablet (150 mg total) by mouth every 3 (three) days. Take first pill today.  May take second pill in 3 days if no resolution of symptoms with first pill. 2 tablet 0   nitrofurantoin, macrocrystal-monohydrate, (MACROBID) 100 MG capsule Take 1 capsule (100 mg total) by mouth 2 (two) times daily. 10 capsule 0   prenatal vitamin w/FE, FA (PRENATAL 1 + 1) 27-1 MG TABS tablet Take 1 tablet by mouth daily at 12 noon.     acetaminophen (TYLENOL) 325 MG tablet Take 2 tablets (650 mg total) by mouth every 6 (six) hours as needed for mild pain, moderate pain, fever or headache (for pain scale < 4). (Patient not taking: Reported on 07/23/2021)      coconut oil OIL Apply 1 application topically as needed (nipple pain). (Patient not taking: Reported on 07/23/2021)  0   ferrous sulfate 324 (65 Fe) MG TBEC Take 1 tablet (325 mg total) by mouth every other day. (Patient not taking: Reported on 07/23/2021) 30 tablet 1   Homeopathic Products (AZO YEAST PLUS PO) Take by mouth.     lidocaine (XYLOCAINE) 2 % jelly Apply 1 application topically every 4 (four) hours as needed. (Patient not taking: Reported on 07/23/2021) 30 mL 0   norethindrone (MICRONOR) 0.35 MG tablet Take 1 tablet (0.35 mg total) by mouth daily. (Patient not taking: Reported on 07/23/2021) 28 tablet 11   No current facility-administered medications on file prior to visit.    No Known Allergies  Social History   Socioeconomic History   Marital status: Single    Spouse name: Not on file   Number of children: Not on file   Years of education: 13   Highest education level: Some college, no degree  Occupational History   Not on file  Tobacco Use   Smoking status: Never   Smokeless tobacco: Never  Vaping Use   Vaping Use: Never used  Substance and Sexual Activity   Alcohol use: Yes   Drug use: Never   Sexual activity: Yes  Other Topics Concern  Not on file  Social History Narrative   Not on file   Social Determinants of Health   Financial Resource Strain: Not on file  Food Insecurity: Not on file  Transportation Needs: Not on file  Physical Activity: Not on file  Stress: Not on file  Social Connections: Not on file  Intimate Partner Violence: Not on file    Family History  Problem Relation Age of Onset   Hypertension Maternal Grandmother    Cancer Paternal Grandmother    Varicose Veins Mother     The following portions of the patient's history were reviewed and updated as appropriate: allergies, current medications, past family history, past medical history, past social history, past surgical history and problem list.  Review of Systems Pertinent items  noted in HPI and remainder of comprehensive ROS otherwise negative.   Objective:  BP 121/65    Pulse 73    Ht 5\' 6"  (1.676 m)    Wt 173 lb (78.5 kg)    LMP 12/01/2021 (Exact Date)    Breastfeeding No    BMI 27.92 kg/m  CONSTITUTIONAL: Well-developed, well-nourished female in no acute distress.  HENT:  Normocephalic, atraumatic, External right and left ear normal. Oropharynx is clear and moist EYES: Conjunctivae and EOM are normal. No scleral icterus.  NECK: Normal range of motion, supple, no masses.  Normal thyroid.  SKIN: Skin is warm and dry. No rash noted. Not diaphoretic. No erythema. No pallor. NEUROLOGIC: Alert and oriented to person, place, and time. Normal reflexes, muscle tone coordination. No cranial nerve deficit noted. PSYCHIATRIC: Normal mood and affect. Normal behavior. Normal judgment and thought content. CARDIOVASCULAR: Normal heart rate noted, regular rhythm RESPIRATORY: Clear to auscultation bilaterally. Effort and breath sounds normal, no problems with respiration noted. BREASTS: Symmetric in size. No masses, skin changes, nipple drainage, or lymphadenopathy. ABDOMEN: Soft, normal bowel sounds, no distention noted.  No tenderness, rebound or guarding.  PELVIC: Normal appearing external genitalia; normal appearing vaginal mucosa and cervix.  No abnormal discharge noted.  Pap smear obtained in January 2022. Normal uterine size, no other palpable masses, no uterine or adnexal tenderness. MUSCULOSKELETAL: Normal range of motion. No tenderness.  No cyanosis, clubbing, or edema.  2+ distal pulses.   Assessment:  Annual gynecologic examination without pap smear   Plan:  Pap not due yet  Routine preventative health maintenance measures emphasized. Please refer to After Visit Summary for other counseling recommendations.

## 2021-12-29 NOTE — Progress Notes (Signed)
Patient states she had recent yeast infection. Armandina Stammer RN

## 2022-03-10 ENCOUNTER — Inpatient Hospital Stay (HOSPITAL_COMMUNITY)
Admission: AD | Admit: 2022-03-10 | Discharge: 2022-03-10 | Disposition: A | Discharge status: Home / Self Care | Payer: No Typology Code available for payment source | Attending: Obstetrics and Gynecology | Admitting: Obstetrics and Gynecology

## 2022-03-10 ENCOUNTER — Encounter (HOSPITAL_COMMUNITY): Payer: Self-pay | Admitting: Obstetrics and Gynecology

## 2022-03-10 ENCOUNTER — Inpatient Hospital Stay (HOSPITAL_COMMUNITY): Payer: No Typology Code available for payment source

## 2022-03-10 ENCOUNTER — Other Ambulatory Visit: Payer: Self-pay

## 2022-03-10 DIAGNOSIS — K5900 Constipation, unspecified: Secondary | ICD-10-CM | POA: Diagnosis not present

## 2022-03-10 DIAGNOSIS — O3680X0 Pregnancy with inconclusive fetal viability, not applicable or unspecified: Secondary | ICD-10-CM | POA: Diagnosis not present

## 2022-03-10 DIAGNOSIS — R1032 Left lower quadrant pain: Secondary | ICD-10-CM | POA: Diagnosis not present

## 2022-03-10 DIAGNOSIS — O99611 Diseases of the digestive system complicating pregnancy, first trimester: Secondary | ICD-10-CM | POA: Insufficient documentation

## 2022-03-10 DIAGNOSIS — O26891 Other specified pregnancy related conditions, first trimester: Secondary | ICD-10-CM | POA: Insufficient documentation

## 2022-03-10 DIAGNOSIS — F418 Other specified anxiety disorders: Secondary | ICD-10-CM | POA: Insufficient documentation

## 2022-03-10 DIAGNOSIS — Z3A01 Less than 8 weeks gestation of pregnancy: Secondary | ICD-10-CM | POA: Insufficient documentation

## 2022-03-10 DIAGNOSIS — F988 Other specified behavioral and emotional disorders with onset usually occurring in childhood and adolescence: Secondary | ICD-10-CM | POA: Insufficient documentation

## 2022-03-10 DIAGNOSIS — O99341 Other mental disorders complicating pregnancy, first trimester: Secondary | ICD-10-CM | POA: Insufficient documentation

## 2022-03-10 DIAGNOSIS — R109 Unspecified abdominal pain: Secondary | ICD-10-CM

## 2022-03-10 LAB — URINALYSIS, ROUTINE W REFLEX MICROSCOPIC
Bilirubin Urine: NEGATIVE
Glucose, UA: NEGATIVE mg/dL
Hgb urine dipstick: NEGATIVE
Ketones, ur: 5 mg/dL — AB
Leukocytes,Ua: NEGATIVE
Nitrite: NEGATIVE
Protein, ur: NEGATIVE mg/dL
Specific Gravity, Urine: 1.028 (ref 1.005–1.030)
pH: 5 (ref 5.0–8.0)

## 2022-03-10 LAB — CBC
HCT: 41.7 % (ref 36.0–46.0)
Hemoglobin: 13.8 g/dL (ref 12.0–15.0)
MCH: 27.7 pg (ref 26.0–34.0)
MCHC: 33.1 g/dL (ref 30.0–36.0)
MCV: 83.7 fL (ref 80.0–100.0)
Platelets: 256 10*3/uL (ref 150–400)
RBC: 4.98 MIL/uL (ref 3.87–5.11)
RDW: 14.1 % (ref 11.5–15.5)
WBC: 6.7 10*3/uL (ref 4.0–10.5)
nRBC: 0 % (ref 0.0–0.2)

## 2022-03-10 LAB — POCT PREGNANCY, URINE: Preg Test, Ur: POSITIVE — AB

## 2022-03-10 LAB — HCG, QUANTITATIVE, PREGNANCY: hCG, Beta Chain, Quant, S: 914 m[IU]/mL — ABNORMAL HIGH (ref <5)

## 2022-03-10 MED ORDER — HYDROXYZINE PAMOATE 25 MG PO CAPS
25.0000 mg | ORAL_CAPSULE | Freq: Three times a day (TID) | ORAL | 0 refills | Status: DC | PRN
Start: 2022-03-10 — End: 2022-04-13

## 2022-03-10 NOTE — MAU Note (Addendum)
Found out I was pregnant Friday. I have been having pain in my abdomen on my left mid side of abdomen that radiates to lower left abd and across lower abd into my pelvis. Denies VB. LMP 01/26/22. I had been smoking cigarettes and marijuana before finding out I was pregnant. Some n/v and alittle light headed this am ?

## 2022-03-10 NOTE — MAU Provider Note (Addendum)
?History  ?  ?[id number] ? ?Arrival date and time: 03/10/22 [id number] ?  ?Chief Complaint  ?Patient presents with  ? Abdominal Pain  ? Emesis During Pregnancy  ? ?HPI ?Lynn Moses is a 26 y.o. at 4913w1d by LMP who presents for abdominal pain. Reports pain in left lower quadrant.  Pain started last week and worsened on Saturday. Hasn't treated symptoms. Vomited twice since last night. Has been having issues with constipation. Had bowel movement yesterday but doesn't feel like she's emptying. Hasn't started treating constipation but plans on taking colace.  ?Denies dysuria, vaginal bleeding, or vaginal discharge.  ? ?OB History   ? ? Gravida  ?2  ? Para  ?1  ? Term  ?1  ? Preterm  ?   ? AB  ?   ? Living  ?1  ?  ? ? SAB  ?   ? IAB  ?   ? Ectopic  ?   ? Multiple  ?0  ? Live Births  ?1  ?   ?  ?  ? ?Past Medical History:  ?Diagnosis Date  ? Anxiety   ? Depression 2020  ? pt has stopped taking her Zoloft when she was trying to get pregnant  ? Ovarian cyst 01/2020  ? ?Past Surgical History:  ?Procedure Laterality Date  ? NO PAST SURGERIES    ? ?Family History  ?Problem Relation Age of Onset  ? Hypertension Maternal Grandmother   ? Cancer Paternal Grandmother   ? Varicose Veins Mother   ? ?No Known Allergies ? ?No current facility-administered medications on file prior to encounter.  ? ?Current Outpatient Medications on File Prior to Encounter  ?Medication Sig Dispense Refill  ? prenatal vitamin w/FE, FA (PRENATAL 1 + 1) 27-1 MG TABS tablet Take 1 tablet by mouth daily at 12 noon.    ? Homeopathic Products (AZO YEAST PLUS PO) Take by mouth.    ? ?ROS ?Pertinent positives and negative per HPI, all others reviewed and negative ? ?Physical Exam  ? ?BP 118/77   Pulse 83   Resp 17   Ht 5\' 6"  (1.676 m)   Wt 170 lb (77.1 kg)   LMP 01/26/2022 (Exact Date)   SpO2 100%   BMI 27.44 kg/m?  ? ?Patient Vitals for the past 24 hrs: ? BP Pulse Resp SpO2 Height Weight  ?03/10/22 0903 118/77 83 -- -- -- --  ?03/10/22 0658 121/64 83 -- -- --  --  ?03/10/22 0634 117/74 74 17 100 % 5\' 6"  (1.676 m) 170 lb (77.1 kg)  ? ?Physical Exam ?Vitals and nursing note reviewed.  ?Constitutional:   ?   General: She is not in acute distress. ?   Appearance: She is well-developed.  ?HENT:  ?   Head: Normocephalic and atraumatic.  ?Pulmonary:  ?   Effort: Pulmonary effort is normal. No respiratory distress.  ?Abdominal:  ?   General: Abdomen is flat.  ?   Palpations: Abdomen is soft.  ?   Tenderness: There is abdominal tenderness in the left lower quadrant. There is no right CVA tenderness, left CVA tenderness, guarding or rebound.  ?Skin: ?   General: Skin is warm and dry.  ?Neurological:  ?   Mental Status: She is alert.  ?Psychiatric:     ?   Mood and Affect: Mood normal.     ?   Behavior: Behavior normal.  ?  ?Labs ?Results for orders placed or performed during the hospital encounter of 03/10/22 (from the past  24 hour(s))  ?Urinalysis, Routine w reflex microscopic     Status: Abnormal  ? Collection Time: 03/10/22  6:40 AM  ?Result Value Ref Range  ? Color, Urine YELLOW YELLOW  ? APPearance HAZY (A) CLEAR  ? Specific Gravity, Urine 1.028 1.005 - 1.030  ? pH 5.0 5.0 - 8.0  ? Glucose, UA NEGATIVE NEGATIVE mg/dL  ? Hgb urine dipstick NEGATIVE NEGATIVE  ? Bilirubin Urine NEGATIVE NEGATIVE  ? Ketones, ur 5 (A) NEGATIVE mg/dL  ? Protein, ur NEGATIVE NEGATIVE mg/dL  ? Nitrite NEGATIVE NEGATIVE  ? Leukocytes,Ua NEGATIVE NEGATIVE  ?Pregnancy, urine POC     Status: Abnormal  ? Collection Time: 03/10/22  6:45 AM  ?Result Value Ref Range  ? Preg Test, Ur POSITIVE (A) NEGATIVE  ?CBC     Status: None  ? Collection Time: 03/10/22  7:11 AM  ?Result Value Ref Range  ? WBC 6.7 4.0 - 10.5 K/uL  ? RBC 4.98 3.87 - 5.11 MIL/uL  ? Hemoglobin 13.8 12.0 - 15.0 g/dL  ? HCT 41.7 36.0 - 46.0 %  ? MCV 83.7 80.0 - 100.0 fL  ? MCH 27.7 26.0 - 34.0 pg  ? MCHC 33.1 30.0 - 36.0 g/dL  ? RDW 14.1 11.5 - 15.5 %  ? Platelets 256 150 - 400 K/uL  ? nRBC 0.0 0.0 - 0.2 %  ?hCG, quantitative, pregnancy      Status: Abnormal  ? Collection Time: 03/10/22  7:11 AM  ?Result Value Ref Range  ? hCG, Beta Chain, Quant, S 914 (H) <5 mIU/mL  ? ?Imaging ?US OB LESS THAN 14 WEEKS WITH OB TRANSVAGINAL ? ?Result Date: 03/10/2022 ?CLINICAL DATA:  26 year old female with abdominal pain in the 1st trimester of pregnancy. EXAM: OBSTETRIC <14 WK [place] AND TRANSVAGINAL OB US TECHNIQUE: Both transabdominal and transvaginal ultrasound examinations were performed for complete evaluation of the gestation as well as the maternal uterus, adnexal regions, and pelvic cul-de-sac. Transvaginal technique was performed to assess early pregnancy. COMPARISON:  Abdomen and pelvis MRI 08/07/2020. FINDINGS: Intrauterine gestational sac: Questionable small endometrial gestational sac near the uterine fundus (image 15). Yolk sac:  Not visible Embryo:  Not visible Cardiac Activity: Not applicable MSD: 2.6 mm   4 w   6 d Subchorionic hemorrhage:  None visualized. Maternal uterus/adnexae: The right ovary appears normal measuring 3.5 x 1.6 x 2.9 cm. The left ovary is larger and likely contains the corpus luteum (image 59). The left ovary is 4.1 x 2.9 x 3.3 cm. Small volume simple appearing free fluid in the cul-de-sac (series 3). IMPRESSION: 1. Possible early intrauterine gestational sac, but no yolk sac, fetal pole, or cardiac activity yet visualized. Recommend follow-up quantitative B-HCG levels and follow-up US in 14 days to confirm and assess viability. This recommendation follows SRU consensus guidelines: Diagnostic Criteria for Nonviable Pregnancy Early in the First Trimester. Malva Limes Med 2013; 885:0277-41. 2. Suspected left ovarian corpus luteum.  Right ovary is normal. Electronically Signed   By: Odessa Fleming M.D.   On: 03/10/2022 07:57   ? ?MAU Course  ?Procedures ?Lab Orders    ?     Urinalysis, Routine w reflex microscopic Urine, Clean Catch    ?     CBC    ?     hCG, quantitative, pregnancy    ?     Pregnancy, urine POC    ?Meds ordered this encounter   ?Medications  ? hydrOXYzine (VISTARIL) 25 MG capsule  ?  Sig: Take 1 capsule (  25 mg total) by mouth 3 (three) times daily as needed. For anxiety  ?  Dispense:  30 capsule  ?  Refill:  0  ?  Order Specific Question:   Supervising Provider  ?  Answer:   Reva Bores [[id number]]  ? ?Imaging Orders    ?     US OB LESS THAN 14 WEEKS WITH OB TRANSVAGINAL    ? ?MDM ?+UPT ?UA, wet prep, GC/chlamydia, CBC, ABO/Rh, quant hCG, and [place] today to rule out ectopic pregnancy which can be life threatening.  ? ?Care turned over to Edd Arbour CNM ?Judeth Horn, NP 03/10/2022 8:30 AM ? ?Assumed care of patient and reviewed labs/imaging. Discussed with patient pregnancy of unknown location and continuing concern for possible ectopic.  Reassured however, that labs and imaging also align well with an early pregnancy with pain due to left corpus luteum. Will need follow up stat quant at [facility] on Friday, admin messaged and appt sent for 8:55am. ? ?During discussion of PUL, pt divulged feeling very anxious about possible ectopic, having another baby with a 81mo at home and starting RN school soon. Has been on Wellbutrin in the past but is not amenable to taking while pregnant unless the symptoms get worse. Reassured her of safety of wellbutrin or a SSRI while pregnant, offered vistaril for now. Pt wants to have the prescription but wait until the viability of the pregnancy is known to start taking. Will send script in. Informed of community resources if the anxiety gets worse. ?Assessment and Plan  ?Pregnancy of unknown location ?[redacted] weeks gestation of pregnancy ?Situational anxiety ? ?Discharge home in stable condition with ectopic precautions ?Follow up at [facility] for stat quant on Friday 03/10/22  ? ?Allergies as of 03/10/2022   ?No Known Allergies ?  ? ?  ?Medication List  ?  ? ?STOP taking these medications   ? ?acetaminophen 325 MG tablet ?Commonly known as: Tylenol ?  ?buPROPion 150 MG 12 hr tablet ?Commonly known as: WELLBUTRIN SR ?   ? ?  ? ?TAKE these medications   ? ?AZO YEAST PLUS PO ?Take by mouth. ?  ?hydrOXYzine 25 MG capsule ?Commonly known as: Vistaril ?Take 1 capsule (25 mg total) by mouth 3 (three) times daily as needed. For anxiety ?

## 2022-03-12 ENCOUNTER — Telehealth: Payer: Self-pay

## 2022-03-12 ENCOUNTER — Other Ambulatory Visit: Payer: Self-pay

## 2022-03-12 ENCOUNTER — Other Ambulatory Visit: Payer: No Typology Code available for payment source

## 2022-03-12 DIAGNOSIS — O26891 Other specified pregnancy related conditions, first trimester: Secondary | ICD-10-CM

## 2022-03-12 DIAGNOSIS — R109 Unspecified abdominal pain: Secondary | ICD-10-CM | POA: Diagnosis not present

## 2022-03-12 DIAGNOSIS — O3680X0 Pregnancy with inconclusive fetal viability, not applicable or unspecified: Secondary | ICD-10-CM

## 2022-03-12 LAB — BETA HCG QUANT (REF LAB): hCG Quant: 1867 m[IU]/mL

## 2022-03-12 NOTE — Telephone Encounter (Signed)
Called pt to discuss hCG Beta results. Left message for pt to call the office back.  ?Jmarion Christiano l Norlene Lanes, CMA  ?

## 2022-03-12 NOTE — Progress Notes (Signed)
Patient sent to lab for STAT hcg. Armandina Stammer RN ?

## 2022-03-12 NOTE — Telephone Encounter (Signed)
Patient called back and made aware that her hcg did double and rise appropriately. Per Dr. Nehemiah Settle orders have been place for her to have ultrasound in two weeks. ?Gave patient the number for imaging department and she can call and schedule this. Patient states understanding.  ?Patient is also scheduled for new ob with our office in mid April. Kathrene Alu RN  ?

## 2022-03-22 ENCOUNTER — Other Ambulatory Visit: Payer: Self-pay

## 2022-03-22 MED ORDER — PROMETHAZINE HCL 25 MG PO TABS: 25.0000 mg | ORAL_TABLET | Freq: Four times a day (QID) | ORAL | 0 refills | Status: DC | PRN

## 2022-03-22 NOTE — Progress Notes (Signed)
Patient called stating that she is early pregnant but unable to keep anything down. Patient states that she has tried the motion sickness medications with no help and that she is unable to keep down solids or fluids. Patient states she has tried bland foods like bananas and toast and they "come right back up." Patient then states she was able to keep down this weekend taco [employer] and [employer]. I advised that these are generally very greasy and upsetting to her stomach.  ? ?Patient states that she tried the bland foods when her stomach got empty and I advised small frequent meals throughout the day. ?Sent in script for phenergan and advised if she cant truly keep down any foods or liquids then she needs to seek care at maternity assessment unit at [facility]. Patient states understanding and will try the phenergan first. Kathrene Alu RN  ?

## 2022-03-25 ENCOUNTER — Ambulatory Visit (HOSPITAL_BASED_OUTPATIENT_CLINIC_OR_DEPARTMENT_OTHER)
Admission: RE | Admit: 2022-03-25 | Discharge: 2022-03-25 | Disposition: A | Discharge status: Home / Self Care | Payer: No Typology Code available for payment source | Source: Ambulatory Visit | Attending: Family Medicine | Admitting: Family Medicine

## 2022-03-25 DIAGNOSIS — O26891 Other specified pregnancy related conditions, first trimester: Secondary | ICD-10-CM | POA: Insufficient documentation

## 2022-03-25 DIAGNOSIS — R109 Unspecified abdominal pain: Secondary | ICD-10-CM | POA: Insufficient documentation

## 2022-03-29 ENCOUNTER — Other Ambulatory Visit: Payer: Self-pay

## 2022-03-29 MED ORDER — PROMETHAZINE HCL 25 MG PO TABS: 25.0000 mg | ORAL_TABLET | Freq: Four times a day (QID) | ORAL | 0 refills | Status: DC | PRN

## 2022-03-29 NOTE — Progress Notes (Unsigned)
Patient states she went out of town and left her phenergan prescription at her mothers house. Patient requesting new script- made her aware they may make her pay full price or may not fill it if the script was filled too close together. Armandina Stammer RN  ?

## 2022-04-06 ENCOUNTER — Other Ambulatory Visit (HOSPITAL_COMMUNITY)
Admission: RE | Admit: 2022-04-06 | Discharge: 2022-04-06 | Disposition: A | Discharge status: Home / Self Care | Payer: No Typology Code available for payment source | Source: Ambulatory Visit | Attending: Family Medicine | Admitting: Family Medicine

## 2022-04-06 ENCOUNTER — Encounter: Payer: Self-pay | Admitting: Advanced Practice Midwife

## 2022-04-06 ENCOUNTER — Ambulatory Visit (INDEPENDENT_AMBULATORY_CARE_PROVIDER_SITE_OTHER): Payer: No Typology Code available for payment source | Admitting: Advanced Practice Midwife

## 2022-04-06 VITALS — BP 110/74 | HR 94 | Wt 158.0 lb

## 2022-04-06 DIAGNOSIS — F4323 Adjustment disorder with mixed anxiety and depressed mood: Secondary | ICD-10-CM

## 2022-04-06 DIAGNOSIS — Z3480 Encounter for supervision of other normal pregnancy, unspecified trimester: Secondary | ICD-10-CM | POA: Diagnosis present

## 2022-04-06 DIAGNOSIS — O219 Vomiting of pregnancy, unspecified: Secondary | ICD-10-CM

## 2022-04-06 MED ORDER — VITAMIN B-6 50 MG PO TABS: 50.0000 mg | ORAL_TABLET | Freq: Two times a day (BID) | ORAL | 3 refills | Status: DC

## 2022-04-06 NOTE — Progress Notes (Signed)
New OB ?Reports to "periods" in Feb ?OB Panel, OB urine, GC/CC today ?Genetic screening offered and accepted. ?

## 2022-04-06 NOTE — Progress Notes (Signed)
?INITIAL OBSTETRICAL VISIT ?Patient name: Lynn Moses MRN [id number]  Date of birth: [birth year] ?Chief Complaint:   ?Initial Prenatal Visit ? ?History of Present Illness:   ?Lynn Moses is a 26 y.o. G69P1001 female at [redacted]w[redacted]d by [place] at 6 weeks with an Estimated Date of Delivery: 11/15/22 being seen today for her initial obstetrical visit.   ?Her obstetrical history is significant for  Depression, Vacuum assisted vaginal delivery for NRFHR. Marland Kitchen   ?Today she reports nausea and depression .  ? ? ?  04/06/2022  ?  9:14 AM 08/07/2020  ? 10:19 AM 07/16/2020  ?  8:19 AM  ?Depression screen PHQ 2/9  ?Decreased Interest 3 2 2   ?Down, Depressed, Hopeless 3 1 1   ?PHQ - 2 Score 6 3 3   ?Altered sleeping 2 2 3   ?Tired, decreased energy 3 2 1   ?Change in appetite 3 0 0  ?Feeling bad or failure about yourself  1 0 2  ?Trouble concentrating 1 0 1  ?Moving slowly or fidgety/restless 0 0 0  ?Suicidal thoughts 1 0 0  ?PHQ-9 Score 17 7 10   ? ? ?Patient's last menstrual period was 01/26/2022 (exact date). ?Last pap 01/21/21. Results were: normal ?Review of Systems:   ?Pertinent items are noted in HPI ?Denies cramping/contractions, leakage of fluid, vaginal bleeding, abnormal vaginal discharge w/ itching/odor/irritation, headaches, visual changes, shortness of breath, chest pain, abdominal pain, severe nausea/vomiting, or problems with urination or bowel movements unless otherwise stated above.  ?Pertinent History Reviewed:  ?Reviewed past medical,surgical, social, obstetrical and family history.  ?Reviewed problem list, medications and allergies. ?OB History  ?Gravida Para Term Preterm AB Living  ?2 1 1     1   ?SAB IAB Ectopic Multiple Live Births  ?      0 1  ?  ?# Outcome Date GA Lbr Len/2nd Weight Sex Delivery Anes PTL Lv  ?2 Current           ?1 Term 12/19/20 [redacted]w[redacted]d 02:42 / 00:14 6 lb 1.9 oz (2.775 kg) M Vag-Vacuum EPI  LIV  ?   Birth Comments: WDL  ? ?Physical Assessment:  ? ?Vitals:  ? 04/06/22 0858  ?BP: 110/74  ?Pulse: 94  ?Weight: 158  lb (71.7 kg)  ?Body mass index is 25.5 kg/m?. ? ?     Physical Examination: ? General appearance - well appearing, and in no distress ? Mental status - alert, oriented to person, place, and time ? Psych:  She has a normal mood and affect ? Skin - warm and dry, normal color, no suspicious lesions noted ? Chest - effort normal, all lung fields clear to auscultation bilaterally ? Heart - normal rate and regular rhythm ? Abdomen - soft, nontender ? Extremities:  No swelling or varicosities noted ? Pelvic - VULVA: normal appearing vulva with no masses, tenderness or lesions  VAGINA: normal appearing vagina with normal color and discharge, no lesions  CERVIX: normal appearing cervix without discharge or lesions, no CMT ? Thin prep pap is not done  ? ?  ?Assessment & Plan:  ?1) Low-Risk Pregnancy G2P1001 at [redacted]w[redacted]d with an Estimated Date of Delivery: 11/15/22  ? ?2) Initial OB visit ? ?3) Depression, long term with recent worsening.  Denies ideations, would like to restart counseling.  Declines meds for now ?Infor given for [facility] and [facility] ? ?4)  Nausea  has Phenergan, declines zofran, agrees to add Vit B6 ? ?Meds: No orders of the defined types were placed in this encounter. ? ? ?  Initial labs obtained ?Continue prenatal vitamins ?Reviewed n/v relief measures and warning s/s to report ?Reviewed recommended weight gain based on pre-gravid BMI ?Encouraged well-balanced diet ?Genetic & carrier screening discussed: requests Panorama, requests NT/IT ?Ultrasound discussed; fetal survey: requested ?CCNC completed> form faxed if has or is planning to apply for medicaid ?The nature of [place] [facility] with multiple MDs and other Advanced Practice Providers was explained to patient; also emphasized that fellows, residents, and students are part of our team. ? ? ?Indications for ASA therapy (per uptodate) ?One of the following: ?H/O preeclampsia, especially early onset/adverse outcome No ?Multifetal  gestation No ?CHTN No ?T1DM or T2DM No ?Chronic kidney disease No ?Autoimmune disease (antiphospholipid syndrome, systemic lupus erythematosus) No ? ?OR Two or more of the following: ?Nulliparity No ?Obesity (BMI>30 kg/m2) No ?Family h/o preeclampsia in mother or sister No ?Age ?35 years No ?Sociodemographic characteristics (African American race, low socioeconomic level) Yes ?Personal risk factors (eg, previous pregnancy w/ LBW or SGA, previous adverse pregnancy outcome [eg, stillbirth], interval >10 years between pregnancies) No ? ?Indications for early A1C (per uptodate) ?BMI >=25 (>=23 in Asian women) AND one of the following ?GDM in a previous pregnancy No ?Previous A1C?5.7, impaired glucose tolerance, or impaired fasting glucose on previous testing No ?First-degree relative with diabetes No ?High-risk race/ethnicity (eg, African American, Latino, Native American, [place] American, [place]) Yes ?History of cardiovascular disease No ?HTN or on therapy for hypertension No ?HDL cholesterol level <35 mg/dL (0.90 mmol/L) and/or a triglyceride level >250 mg/dL (2.82 mmol/L) No ?PCOS No ?Physical inactivity No ?Other clinical condition associated with insulin resistance (eg, severe obesity, acanthosis nigricans) No ?Previous birth of an infant weighing ?4000 g No ?Previous stillbirth of unknown cause No ?>= 40yo No ? ?Follow-up: No follow-ups on file.  ? ?Orders Placed This Encounter  ?Procedures  ? Culture, OB Urine  ? [place] MFM OB COMP + 14 WK  ? CBC/D/Plt+RPR+Rh+ABO+RubIgG...  ? Ambulatory referral to [facility]  ? ? ?Hansel Feinstein CNM, WHNP-BC ?04/06/2022 ?11:55 AM ? ?

## 2022-04-07 ENCOUNTER — Encounter: Payer: Self-pay | Admitting: General Practice

## 2022-04-07 LAB — CBC/D/PLT+RPR+RH+ABO+RUBIGG...
Antibody Screen: NEGATIVE
Basophils Absolute: 0 10*3/uL (ref 0.0–0.2)
Basos: 0 %
EOS (ABSOLUTE): 0.1 10*3/uL (ref 0.0–0.4)
Eos: 1 %
HCV Ab: NONREACTIVE
HIV Screen 4th Generation wRfx: NONREACTIVE
Hematocrit: 44.4 % (ref 34.0–46.6)
Hemoglobin: 15 g/dL (ref 11.1–15.9)
Hepatitis B Surface Ag: NEGATIVE
Immature Grans (Abs): 0 10*3/uL (ref 0.0–0.1)
Immature Granulocytes: 0 %
Lymphocytes Absolute: 1.2 10*3/uL (ref 0.7–3.1)
Lymphs: 14 %
MCH: 27.9 pg (ref 26.6–33.0)
MCHC: 33.8 g/dL (ref 31.5–35.7)
MCV: 83 fL (ref 79–97)
Monocytes Absolute: 0.5 10*3/uL (ref 0.1–0.9)
Monocytes: 6 %
Neutrophils Absolute: 7.1 10*3/uL — ABNORMAL HIGH (ref 1.4–7.0)
Neutrophils: 79 %
Platelets: 258 10*3/uL (ref 150–450)
RBC: 5.38 x10E6/uL — ABNORMAL HIGH (ref 3.77–5.28)
RDW: 14.3 % (ref 11.7–15.4)
RPR Ser Ql: NONREACTIVE
Rh Factor: POSITIVE
Rubella Antibodies, IGG: 3.3 index (ref >0.99)
WBC: 9.1 10*3/uL (ref 3.4–10.8)

## 2022-04-07 LAB — URINE CYTOLOGY ANCILLARY ONLY
Chlamydia: NEGATIVE
Comment: NEGATIVE
Comment: NORMAL
Neisseria Gonorrhea: NEGATIVE

## 2022-04-07 LAB — HCV INTERPRETATION

## 2022-04-08 LAB — CULTURE, OB URINE

## 2022-04-08 LAB — URINE CULTURE, OB REFLEX

## 2022-04-13 ENCOUNTER — Other Ambulatory Visit: Payer: Self-pay

## 2022-04-13 ENCOUNTER — Inpatient Hospital Stay (HOSPITAL_COMMUNITY)
Admission: AD | Admit: 2022-04-13 | Discharge: 2022-04-13 | Disposition: A | Discharge status: Home / Self Care | Payer: Medicaid Other | Attending: Obstetrics and Gynecology | Admitting: Obstetrics and Gynecology

## 2022-04-13 ENCOUNTER — Encounter (HOSPITAL_COMMUNITY): Payer: Self-pay | Admitting: Obstetrics and Gynecology

## 2022-04-13 ENCOUNTER — Telehealth: Payer: Self-pay

## 2022-04-13 DIAGNOSIS — Z3A09 9 weeks gestation of pregnancy: Secondary | ICD-10-CM | POA: Diagnosis not present

## 2022-04-13 DIAGNOSIS — O26891 Other specified pregnancy related conditions, first trimester: Secondary | ICD-10-CM | POA: Insufficient documentation

## 2022-04-13 DIAGNOSIS — R1031 Right lower quadrant pain: Secondary | ICD-10-CM | POA: Diagnosis not present

## 2022-04-13 DIAGNOSIS — O210 Mild hyperemesis gravidarum: Secondary | ICD-10-CM | POA: Insufficient documentation

## 2022-04-13 DIAGNOSIS — O219 Vomiting of pregnancy, unspecified: Secondary | ICD-10-CM

## 2022-04-13 DIAGNOSIS — R102 Pelvic and perineal pain: Secondary | ICD-10-CM | POA: Diagnosis not present

## 2022-04-13 DIAGNOSIS — Z87891 Personal history of nicotine dependence: Secondary | ICD-10-CM | POA: Diagnosis not present

## 2022-04-13 LAB — URINALYSIS, ROUTINE W REFLEX MICROSCOPIC
Bilirubin Urine: NEGATIVE
Glucose, UA: NEGATIVE mg/dL
Hgb urine dipstick: NEGATIVE
Ketones, ur: 20 mg/dL — AB
Nitrite: NEGATIVE
Protein, ur: NEGATIVE mg/dL
Specific Gravity, Urine: 1.014 (ref 1.005–1.030)
pH: 7 (ref 5.0–8.0)

## 2022-04-13 LAB — COMPREHENSIVE METABOLIC PANEL
ALT: 15 U/L (ref 0–44)
AST: 14 U/L — ABNORMAL LOW (ref 15–41)
Albumin: 3.9 g/dL (ref 3.5–5.0)
Alkaline Phosphatase: 33 U/L — ABNORMAL LOW (ref 38–126)
Anion gap: 8 (ref 5–15)
BUN: 5 mg/dL — ABNORMAL LOW (ref 6–20)
CO2: 25 mmol/L (ref 22–32)
Calcium: 9.6 mg/dL (ref 8.9–10.3)
Chloride: 105 mmol/L (ref 98–111)
Creatinine, Ser: 0.63 mg/dL (ref 0.44–1.00)
GFR, Estimated: >60 mL/min (ref >60)
Glucose, Bld: 81 mg/dL (ref 70–99)
Potassium: 4.1 mmol/L (ref 3.5–5.1)
Sodium: 138 mmol/L (ref 135–145)
Total Bilirubin: 0.7 mg/dL (ref 0.3–1.2)
Total Protein: 6.6 g/dL (ref 6.5–8.1)

## 2022-04-13 MED ORDER — LACTATED RINGERS IV BOLUS
1000.0000 mL | Freq: Once | INTRAVENOUS | Status: AC
  Administered 2022-04-13: 1000 mL via INTRAVENOUS

## 2022-04-13 MED ORDER — ONDANSETRON 4 MG PO TBDP
8.0000 mg | ORAL_TABLET | Freq: Once | ORAL | Status: AC
  Administered 2022-04-13: 8 mg via ORAL
  Filled 2022-04-13: qty 2

## 2022-04-13 MED ORDER — ONDANSETRON 4 MG PO TBDP
4.0000 mg | ORAL_TABLET | Freq: Four times a day (QID) | ORAL | 4 refills | Status: DC | PRN
Start: 2022-04-13 — End: 2022-08-24

## 2022-04-13 MED ORDER — PROMETHAZINE HCL 25 MG PO TABS: 25.0000 mg | ORAL_TABLET | Freq: Once | ORAL | Status: DC

## 2022-04-13 NOTE — Telephone Encounter (Signed)
Pt called stating she took a Phenergan at 3:10 pm and threw up 15 minutes after taking the medication and wants to know should she take another pill. Pt states she has lost a lot of weight and is unable to keep anything down today. Advised pt to go to [facility] at [facility] to be seen. Understanding was voiced. ?Devorah Givhan l Parlee Amescua, CMA  ?

## 2022-04-13 NOTE — MAU Note (Signed)
Lynn Moses is a 26 y.o. at [redacted]w[redacted]d here in MAU reporting: N/V, reports can't keep anything down including foods and liquids.  States has vomited 7 times since last night.  Also reporting sharp intermittent lower abdominal pain that began several weeks ago.  Denies VB. ? ?Onset of complaint: vomiting since last night/abd pain several weeks ago ?Pain score: 7 ?Vitals:  ? 04/13/22 1747  ?BP: 117/68  ?Pulse: 76  ?Resp: 18  ?Temp: 98.3 ?F (36.8 ?C)  ?SpO2: 100%  ?   ?FHT:N/A ?Lab orders placed from triage:   UA ?

## 2022-04-13 NOTE — MAU Provider Note (Addendum)
?History  ?  ? ?CSN: [id number] ? ?Arrival date and time: 04/13/22 1656 ? ?  Event Date/Time  ? First Provider Initiated Contact with Patient 04/13/22 1848   ?   ? ?Chief Complaint  ?Patient presents with  ? Abdominal Pain  ? Nausea  ? Emesis  ? ? ?Lynn Moses is a 26 year old G2P1 at 5846w1d who presents for nausea and vomiting for the past couple of weeks.  Patient reports she has had nausea and vomiting since the start of pregnancy however she has had 8 episodes of emesis since last night at 11 PM.  Patient reports she has not been able to keep anything down including fluid, or fluid.  She reports also having bad nausea and vomiting with her previous pregnancy.  Nothing in the past improved her symptom and patient reports intermittent lightheadedness which emesis.  Patient denies any sick contacts but reports history of constipation with last BM being 4 days ago which was a small hard stool. She endorses 15lb eight loss since the start of pregnancy. Reports vague lower abdominal pain in the pelvic area but more concentrated on the right. No blood in stool. She denies any blood in stool, chest pain, headache or SOB. ? ?Abdominal Pain ?Associated symptoms include constipation, nausea and vomiting.  ?Emesis  ?Associated symptoms include abdominal pain. Pertinent negatives include no chest pain.  ? ?OB History   ? ? Gravida  ?2  ? Para  ?1  ? Term  ?1  ? Preterm  ?   ? AB  ?   ? Living  ?1  ?  ? ? SAB  ?   ? IAB  ?   ? Ectopic  ?   ? Multiple  ?0  ? Live Births  ?1  ?   ?  ?  ? ? ?Past Medical History:  ?Diagnosis Date  ? Anxiety   ? Depression 2020  ? pt has stopped taking her Zoloft when she was trying to get pregnant  ? Ovarian cyst 01/2020  ? ? ?Past Surgical History:  ?Procedure Laterality Date  ? NO PAST SURGERIES    ? ? ?Family History  ?Problem Relation Age of Onset  ? Varicose Veins Mother   ? Heart disease Maternal Grandmother   ? Hypertension Maternal Grandmother   ? Cancer Paternal Grandmother    ? ? ?Social History  ? ?Tobacco Use  ? Smoking status: Former  ?  Types: Cigarettes  ? Smokeless tobacco: Never  ?Vaping Use  ? Vaping Use: Never used  ?Substance Use Topics  ? Alcohol use: Not Currently  ? Drug use: Never  ? ? ?Allergies: No Known Allergies ? ?Medications Prior to Admission  ?Medication Sig Dispense Refill Last Dose  ? promethazine (PHENERGAN) 25 MG tablet Take 1 tablet (25 mg total) by mouth every 6 (six) hours as needed for nausea or vomiting. 30 tablet 0 04/13/2022 at 1505  ? Homeopathic Products (AZO YEAST PLUS PO) Take by mouth.     ? hydrOXYzine (VISTARIL) 25 MG capsule Take 1 capsule (25 mg total) by mouth 3 (three) times daily as needed. For anxiety (Patient not taking: Reported on 04/06/2022) 30 capsule 0   ? prenatal vitamin w/FE, FA (PRENATAL 1 + 1) 27-1 MG TABS tablet Take 1 tablet by mouth daily at 12 noon.   04/11/2022  ? pyridOXINE (VITAMIN B-6) 50 MG tablet Take 1 tablet (50 mg total) by mouth in the morning and at bedtime. 60 tablet 3   ? ? ?  Review of Systems  ?Respiratory:  Negative for apnea, chest tightness and shortness of breath.   ?Cardiovascular:  Negative for chest pain, palpitations and leg swelling.  ?Gastrointestinal:  Positive for abdominal pain, constipation, nausea and vomiting. Negative for anal bleeding.  ?Physical Exam  ? ?Blood pressure 117/68, pulse 76, temperature 98.3 ?F (36.8 ?C), temperature source Oral, resp. rate 18, height 5\' 6"  (1.676 m), weight 70.4 kg, last menstrual period 01/26/2022, SpO2 100 %, not currently breastfeeding. ? ?Physical Exam ?Constitutional:   ?   Appearance: She is well-developed.  ?HENT:  ?   Mouth/Throat:  ?   Mouth: Mucous membranes are dry.  ?Cardiovascular:  ?   Rate and Rhythm: Normal rate and regular rhythm.  ?   Heart sounds: Normal heart sounds.  ?Abdominal:  ?   General: Bowel sounds are normal.  ?   Palpations: Abdomen is soft.  ?   Tenderness: There is abdominal tenderness in the suprapubic area and left lower quadrant.   ?Skin: ?   General: Skin is warm.  ?   Capillary Refill: Capillary refill takes 2 to 3 seconds.  ?Neurological:  ?   General: No focal deficit present.  ?   Mental Status: She is alert and oriented to person, place, and time.  ?Psychiatric:     ?   Mood and Affect: Mood normal.     ?   Behavior: Behavior normal.  ? ? ?MAU Course  ? ?Orders Placed This Encounter  ?Procedures  ? Urinalysis, Routine w reflex microscopic Urine, Clean Catch  ? Comprehensive metabolic panel  ? Insert peripheral IV  ? Discharge patient  ? ?Meds ordered this encounter  ?Medications  ? ondansetron (ZOFRAN-ODT) disintegrating tablet 8 mg  ? lactated ringers bolus 1,000 mL  ? promethazine (PHENERGAN) tablet 25 mg  ? ondansetron (ZOFRAN-ODT) 4 MG disintegrating tablet  ?  Sig: Take 1 tablet (4 mg total) by mouth every 6 (six) hours as needed for nausea.  ?  Dispense:  20 tablet  ?  Refill:  4  ?  Order Specific Question:   Supervising Provider  ?  Answer:   01/28/2022 [2724]  ? ? ?Care of patient turned over to Reva Bores at 19:59. ? ?No vomiting while in MAU. Able to keep down crackers. Initially asked for PO Phenergan but then said she felt OK to go home and take her own Phenergan. ? ?Assessment ?1. Nausea and vomiting during pregnancy   ?2. [redacted] weeks gestation of pregnancy   ? ?Plan ?Discharge home in stable condition ?Hyperemesis precautions ? Follow-up Information   ? ? [facility] Follow up.   ?Specialty: Obstetrics and Gynecology ?Why: As scheduled or sooner, As needed if symptoms worsen ?Contact information: ?[address] ?[address] ?[place] ?[phone] ? ?  ?  ? ? [facility] Follow up.   ?Specialty: Obstetrics and Gynecology ?Why: As needed in emergencies ?Contact information: ?[address] ?[address] ?[place] ?[phone] ? ?  ?  ? ?  ?  ? ?  ? ?Allergies as of 04/13/2022   ?No Known  Allergies ?  ? ?  ?Medication List  ?  ? ?STOP taking these medications   ? ?AZO YEAST PLUS PO ?  ?hydrOXYzine 25 MG capsule ?Commonly known as: Vistaril ?  ?pyridOXINE 50 MG tablet ?Commonly known as: VITAMIN B-6 ?  ? ?  ? ?TAKE these medications   ? ?  ondansetron 4 MG disintegrating tablet ?Commonly known as: ZOFRAN-ODT ?Take 1 tablet (4 mg total) by mouth every 6 (six) hours as needed for nausea. ?  ?prenatal vitamin w/FE, FA 27-1 MG Tabs tablet ?Take 1 tablet by mouth daily at 12 noon. ?  ?promethazine 25 MG tablet ?Commonly known as: PHENERGAN ?Take 1 tablet (25 mg total) by mouth every 6 (six) hours as needed for nausea or vomiting. ?  ? ?  ? ? ? ?Katrinka Blazing, [place], CNM ?04/13/2022 ?9:46 PM ? ? ?  ?

## 2022-05-06 ENCOUNTER — Ambulatory Visit (INDEPENDENT_AMBULATORY_CARE_PROVIDER_SITE_OTHER): Payer: No Typology Code available for payment source | Admitting: Family Medicine

## 2022-05-06 VITALS — BP 126/68 | HR 83 | Wt 156.0 lb

## 2022-05-06 DIAGNOSIS — Z3480 Encounter for supervision of other normal pregnancy, unspecified trimester: Secondary | ICD-10-CM

## 2022-05-06 DIAGNOSIS — Z3481 Encounter for supervision of other normal pregnancy, first trimester: Secondary | ICD-10-CM | POA: Diagnosis not present

## 2022-05-06 DIAGNOSIS — Z3A12 12 weeks gestation of pregnancy: Secondary | ICD-10-CM

## 2022-05-06 DIAGNOSIS — F418 Other specified anxiety disorders: Secondary | ICD-10-CM

## 2022-05-06 NOTE — Progress Notes (Signed)
? ?  PRENATAL VISIT NOTE ? ?Subjective:  ?Lynn Moses is a 26 y.o. G2P1001 at [redacted]w[redacted]d being seen today for ongoing prenatal care.  She is currently monitored for the following issues for this low-risk pregnancy and has History of depression; Mixed anxiety and depressive disorder; Attention deficit disorder; and Supervision of other normal pregnancy, antepartum on their problem list. ? ?Patient reports  nausea and vomiting improved .  Contractions: Not present. Vag. Bleeding: None.   . Denies leaking of fluid.  ? ?The following portions of the patient's history were reviewed and updated as appropriate: allergies, current medications, past family history, past medical history, past social history, past surgical history and problem list.  ? ?Objective:  ? ?Vitals:  ? 05/06/22 1404  ?BP: 126/68  ?Pulse: 83  ?Weight: 156 lb (70.8 kg)  ? ? ?Fetal Status:          ? ?General:  Alert, oriented and cooperative. Patient is in no acute distress.  ?Skin: Skin is warm and dry. No rash noted.   ?Cardiovascular: Normal heart rate noted  ?Respiratory: Normal respiratory effort, no problems with respiration noted  ?Abdomen: Soft, gravid, appropriate for gestational age.  Pain/Pressure: Present     ?Pelvic: Cervical exam deferred        ?Extremities: Normal range of motion.  Edema: None  ?Mental Status: Normal mood and affect. Normal behavior. Normal judgment and thought content.  ? ?Assessment and Plan:  ?Pregnancy: G2P1001 at [redacted]w[redacted]d ?1. Supervision of other normal pregnancy, antepartum ?FHT and FH normal ?- Genetic Screening ? ?2. Mixed anxiety and depressive disorder ? ?3. [redacted] weeks gestation of pregnancy ? ?- Genetic Screening ? ?Preterm labor symptoms and general obstetric precautions including but not limited to vaginal bleeding, contractions, leaking of fluid and fetal movement were reviewed in detail with the patient. ?Please refer to After Visit Summary for other counseling recommendations.  ? ?No follow-ups on file. ? ?Future  Appointments  ?Date Time Provider Department Center  ?05/14/2022  8:30 AM Early, Sung Amabile, NP [facility]  ?06/08/2022  9:35 AM Aviva Signs, CNM [facility] None  ?06/10/2022 10:45 AM [facility] US5 [facility]  ?07/06/2022  8:35 AM Aviva Signs, CNM [facility] None  ? ? ?Levie Heritage, DO ? ?

## 2022-05-06 NOTE — Progress Notes (Signed)
Patient complaining of nausea and vomiting. Kathrene Alu RN  ?

## 2022-05-13 ENCOUNTER — Encounter: Payer: BLUE CROSS/BLUE SHIELD | Admitting: Family Medicine

## 2022-05-14 ENCOUNTER — Encounter (HOSPITAL_BASED_OUTPATIENT_CLINIC_OR_DEPARTMENT_OTHER): Payer: Self-pay | Admitting: Nurse Practitioner

## 2022-05-14 ENCOUNTER — Ambulatory Visit (INDEPENDENT_AMBULATORY_CARE_PROVIDER_SITE_OTHER): Payer: No Typology Code available for payment source | Admitting: Nurse Practitioner

## 2022-05-14 VITALS — BP 128/88 | HR 88 | Ht 66.0 in | Wt 156.0 lb

## 2022-05-14 DIAGNOSIS — Z111 Encounter for screening for respiratory tuberculosis: Secondary | ICD-10-CM

## 2022-05-14 DIAGNOSIS — Z0000 Encounter for general adult medical examination without abnormal findings: Secondary | ICD-10-CM | POA: Diagnosis not present

## 2022-05-14 DIAGNOSIS — Z8659 Personal history of other mental and behavioral disorders: Secondary | ICD-10-CM | POA: Insufficient documentation

## 2022-05-14 DIAGNOSIS — Z3480 Encounter for supervision of other normal pregnancy, unspecified trimester: Secondary | ICD-10-CM | POA: Diagnosis not present

## 2022-05-14 NOTE — Progress Notes (Signed)
Orma Render, DNP, AGNP-c Primary Care & Sports Medicine [address]  [place] [employer], [place] [address] [phone]  New patient visit   Patient: Lynn Moses   DOB: Dec 22, 1996   25 y.o. Female  MRN: [id number] Visit Date: 05/14/2022  Patient Care Team: Mekenna Finau, Coralee Pesa, NP as PCP - General (Nurse Practitioner)  Today's Vitals   05/14/22 0836  BP: 128/88  Pulse: 88  SpO2: 92%  Weight: 156 lb (70.8 kg)  Height: 5' 6"  (1.676 m)   Body mass index is 25.18 kg/m.   Today's healthcare provider: Orma Render, NP   No chief complaint on file.  Subjective    Lynn Moses is a 26 y.o. female who presents today as a new patient to establish care.    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  CPE performed today.  No chronic medical hx Has 1 son at home, pregnant with second child at this time Initially had some weight loss (15-17lb) in first trimester of pregnancy due to nausea, but this is improving. Able to hold down food and liquids without concern. Regaining weight. Feeling good overall.  Excited about starting nursing program in the fall.   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   Ovarian cyst 01/2020   Past Surgical History:  Procedure Laterality Date   NO PAST SURGERIES     Family Status  Relation Name  Status   Mother  Alive   Father  Alive   MGM  Alive   MGF  Alive   PGM  Deceased   Family History  Problem Relation Age of Onset   Varicose Veins Mother    Heart disease Maternal Grandmother    Hypertension Maternal Grandmother    Cancer Paternal Grandmother    Social History   Socioeconomic History   Marital status: Soil scientist    Spouse name: Not on file   Number of children: 1   Years of education: 13   Highest education level: Some college, no degree  Occupational History   Not on file  Tobacco Use   Smoking status: Former    Types: Cigarettes   Smokeless tobacco: Never  Vaping Use   Vaping Use: Never used  Substance and Sexual Activity   Alcohol use: Not Currently   Drug use: Never   Sexual activity: Yes    Birth control/protection: None  Other Topics Concern   Not on file  Social History Narrative   Not on file   Social Determinants of Health   Financial Resource Strain: Not on file  Food Insecurity: Not on file  Transportation Needs: Not on file  Physical Activity: Not on file  Stress: Not on file  Social Connections: Not on file   Outpatient Medications Prior to Visit  Medication Sig   prenatal vitamin w/FE, FA (PRENATAL 1 +  1) 27-1 MG TABS tablet Take 1 tablet by mouth daily at 12 noon.   promethazine (PHENERGAN) 25 MG tablet Take 1 tablet (25 mg total) by mouth every 6 (six) hours as needed for nausea or vomiting.   ondansetron (ZOFRAN-ODT) 4 MG disintegrating tablet Take 1 tablet (4 mg total) by mouth every 6 (six) hours as needed for nausea. (Patient not taking: Reported on 05/06/2022)   No facility-administered medications prior to visit.   No Known Allergies Immunization History  Administered Date(s) Administered   Influenza,inj,Quad PF,6+ Mos 10/03/2020   MMR 12/20/2020   PFIZER(Purple Top)SARS-COV-2 Vaccination 12/02/2020, 12/23/2020   Tdap 10/03/2020    Review of Systems All review of systems negative except what is listed in the  HPI   Objective    BP 128/88   Pulse 88   Ht 5' 6"  (1.676 m)   Wt 156 lb (70.8 kg)   LMP 01/26/2022 (Exact Date)   SpO2 92%   BMI 25.18 kg/m  Physical Exam Vitals and nursing note reviewed.  Constitutional:      General: She is not in acute distress.    Appearance: Normal appearance.  HENT:     Head: Normocephalic and atraumatic.     Right Ear: Hearing, tympanic membrane, ear canal and external ear normal.     Left Ear: Hearing, tympanic membrane, ear canal and external ear normal.     Nose: Nose normal.     Right Sinus: No maxillary sinus tenderness or frontal sinus tenderness.     Left Sinus: No maxillary sinus tenderness or frontal sinus tenderness.     Mouth/Throat:     Lips: Pink.     Mouth: Mucous membranes are moist.     Pharynx: Oropharynx is clear.  Eyes:     General: Lids are normal. Vision grossly intact.     Extraocular Movements: Extraocular movements intact.     Conjunctiva/sclera: Conjunctivae normal.     Pupils: Pupils are equal, round, and reactive to light.     Funduscopic exam:    Right eye: Red reflex present.        Left eye: Red reflex present.    Visual Fields: Right eye visual fields normal and left eye visual fields normal.  Neck:     Thyroid: No thyromegaly.     Vascular: No carotid bruit.  Cardiovascular:     Rate and Rhythm: Normal rate and regular rhythm.     Chest Wall: PMI is not displaced.     Pulses: Normal pulses.          Dorsalis pedis pulses are 2+ on the right side and 2+ on the left side.       Posterior tibial pulses are 2+ on the right side and 2+ on the left side.     Heart sounds: Normal heart sounds. No murmur heard. Pulmonary:     Effort: Pulmonary effort is normal. No respiratory distress.     Breath sounds: Normal breath sounds.  Abdominal:     General: Abdomen is flat. Bowel sounds are normal. There is no distension.     Palpations: Abdomen is soft. There is no hepatomegaly, splenomegaly or mass.     Tenderness:  There is no abdominal tenderness. There is no right CVA tenderness, left CVA tenderness, guarding or rebound.  Musculoskeletal:        General: Normal range of motion.     Cervical back: Full passive range of motion without pain, normal range of motion and neck  supple. No tenderness.     Right lower leg: No edema.     Left lower leg: No edema.  Feet:     Left foot:     Toenail Condition: Left toenails are normal.  Lymphadenopathy:     Cervical: No cervical adenopathy.     Upper Body:     Right upper body: No supraclavicular adenopathy.     Left upper body: No supraclavicular adenopathy.  Skin:    General: Skin is warm and dry.     Capillary Refill: Capillary refill takes less than 2 seconds.     Nails: There is no clubbing.  Neurological:     General: No focal deficit present.     Mental Status: She is alert and oriented to person, place, and time.     GCS: GCS eye subscore is 4. GCS verbal subscore is 5. GCS motor subscore is 6.     Sensory: Sensation is intact.     Motor: Motor function is intact. No weakness.     Coordination: Coordination is intact.     Gait: Gait is intact.     Deep Tendon Reflexes: Reflexes are normal and symmetric. Reflexes normal.  Psychiatric:        Attention and Perception: Attention normal.        Mood and Affect: Mood normal.        Speech: Speech normal.        Behavior: Behavior normal. Behavior is cooperative.        Thought Content: Thought content normal.        Cognition and Memory: Cognition and memory normal.        Judgment: Judgment normal.    No results found for any visits on 05/14/22.  Assessment & Plan      Problem List Items Addressed This Visit     History of depression - Primary    Well controlled at this time with no alarm sx present. She is not on medication at this time due to pregnancy. 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 Paperwork signed and provided to patient today.   If you feel that your depression symptoms are returning, please let me or the OB know and we can work to find medication options that are safest during pregnancy to best help you manage.         Supervision of other normal pregnancy, antepartum    No alarm sx present at this time. Gaining weight back after initial loss. Able to tolerate liquids and solids. No signs of dehydration at this time. Following closely with OB. Will continue to collaborate care. No alarm sx. Labs with OB completed.        History of anxiety    No concerning findings present today. 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  No alarm sx present.  If you feel that your anxiety symptoms are worsening or becoming problematic, please reach out to me or OB so that we can find safe options for management during pregnancy.         Other Visit Diagnoses     Screening-pulmonary TB       Relevant Orders   QuantiFERON-TB Gold Plus   Encounter for medical examination to establish care  Return in about 1 year (around 05/15/2023) for CPE or sooner if needed.      Latrell Potempa, Coralee Pesa, NP, DNP, AGNP-C Primary Care & Sports Medicine at [facility]

## 2022-05-14 NOTE — Patient Instructions (Signed)
Thank you for choosing [place] [facility] for your Primary Care needs. I am excited for the opportunity to partner with you to meet your health care goals. It was a pleasure meeting you today!  Recommendations from today's visit: You will see the results of the TB test on MyChart. If you need me to sign anything for this, just let me know.  Let me know if there are any concerns or questions.  CONGRATULATIONS!!! GOOD LUCK!!   Information on diet, exercise, and health maintenance recommendations are listed below. This is information to help you be sure you are on track for optimal health and monitoring.   Please look over this and let us know if you have any questions or if you have completed any of the health maintenance outside of [place] so that we can be sure your records are up to date.  ___________________________________________________________ About Me: I am an Adult-Geriatric Nurse Practitioner with a background in caring for patients for more than 20 years with a strong intensive care background. I provide primary care and sports medicine services to patients age 23 and older within this office. My education had a strong focus on caring for the older adult population, which I am passionate about. I am also the director of the APP Fellowship with [facility].   My desire is to provide you with the best service through preventive medicine and supportive care. I consider you a part of the medical team and value your input. I work diligently to ensure that you are heard and your needs are met in a safe and effective manner. I want you to feel comfortable with me as your provider and want you to know that your health concerns are important to me.  For your information, our office hours are: Monday, Tuesday, and Thursday 8:00 AM - 5:00 PM Wednesday and Friday 8:00 AM - 12:00 PM.   In my time away from the office I am teaching new APP's within the system and am  unavailable, but my partner, Dr. Burnard Bunting is in the office for emergent needs.   If you have questions or concerns, please call our office at [phone] or send [place] a MyChart message and we will respond as quickly as possible.  ____________________________________________________________ MyChart:  For all urgent or time sensitive needs we ask that you please call the office to avoid delays. Our number is [phone]. MyChart is not constantly monitored and due to the large volume of messages a day, replies may take up to 72 business hours.  MyChart Policy: MyChart allows for you to see your visit notes, after visit summary, provider recommendations, lab and tests results, make an appointment, request refills, and contact your provider or the office for non-urgent questions or concerns. Providers are seeing patients during normal business hours and do not have built in time to review MyChart messages.  We ask that you allow a minimum of 3 business days for responses to [employer]. For this reason, please do not send urgent requests through [place]. Please call the office at [phone]. New and ongoing conditions may require a visit. We have virtual and in person visit available for your convenience.  Complex MyChart concerns may require a visit. Your provider may request you schedule a virtual or in person visit to ensure we are providing the best care possible. MyChart messages sent after 11:00 AM on Friday will not be received by the provider until Monday morning.    Lab  and Test Results: You will receive your lab and test results on MyChart as soon as they are completed and results have been sent by the lab or testing facility. Due to this service, you will receive your results BEFORE your provider.  I review lab and tests results each morning prior to seeing patients. Some results require collaboration with other providers to ensure you are receiving the most appropriate care. For this  reason, we ask that you please allow a minimum of 3-5 business days from the time the ALL results have been received for your provider to receive and review lab and test results and contact you about these.  Most lab and test result comments from the provider will be sent through [place]. Your provider may recommend changes to the plan of care, follow-up visits, repeat testing, ask questions, or request an office visit to discuss these results. You may reply directly to this message or call the office at [phone] to provide information for the provider or set up an appointment. In some instances, you will be called with test results and recommendations. Please let us know if this is preferred and we will make note of this in your chart to provide this for you.    If you have not heard a response to your lab or test results in 5 business days from all results returning to [place], please call the office to let us know. We ask that you please avoid calling prior to this time unless there is an emergent concern. Due to high call volumes, this can delay the resulting process.  After Hours: For all non-emergency after hours needs, please call the office at [phone] and select the option to reach the on-call provider service. On-call services are shared between multiple [place] offices and therefore it will not be possible to speak directly with your provider. On-call providers may provide medical advice and recommendations, but are unable to provide refills for maintenance medications.  For all emergency or urgent medical needs after normal business hours, we recommend that you seek care at the closest Urgent Care or Emergency Department to ensure appropriate treatment in a timely manner.  [facility] [place] has a 24 hour emergency room located on the ground floor for your convenience.   Urgent Concerns During the Business Day Providers are seeing patients from 8AM to [place] with a  busy schedule and are most often not able to respond to non-urgent calls until the end of the day or the next business day. If you should have URGENT concerns during the day, please call and speak to the nurse or schedule a same day appointment so that we can address your concern without delay.   Thank you, again, for choosing me as your health care partner. I appreciate your trust and look forward to learning more about you.   Lynn Keeler, DNP, AGNP-c ___________________________________________________________  Health Maintenance Recommendations Screening Testing Mammogram Every 1 -2 years based on history and risk factors Starting at age 50 Pap Smear Ages 21-39 every 3 years Ages 65-65 every 5 years with HPV testing More frequent testing may be required based on results and history Colon Cancer Screening Every 1-10 years based on test performed, risk factors, and history Starting at age 39 Bone Density Screening Every 2-10 years based on history Starting at age 45 for women Recommendations for men differ based on medication usage, history, and risk factors AAA Screening One time ultrasound Men 6-50 years old who have every  smoked Lung Cancer Screening Low Dose Lung CT every 12 months Age 65-80 years with a 30 pack-year smoking history who still smoke or who have quit within the last 15 years  Screening Labs Routine  Labs: Complete Blood Count (CBC), Complete Metabolic Panel (CMP), Cholesterol (Lipid Panel) Every 6-12 months based on history and medications May be recommended more frequently based on current conditions or previous results Hemoglobin A1c Lab Every 3-12 months based on history and previous results Starting at age 52 or earlier with diagnosis of diabetes, high cholesterol, BMI >26, and/or risk factors Frequent monitoring for patients with diabetes to ensure blood sugar control Thyroid Panel (TSH w/ T3 & T4) Every 6 months based on history, symptoms, and risk  factors May be repeated more often if on medication HIV One time testing for all patients 105 and older May be repeated more frequently for patients with increased risk factors or exposure Hepatitis C One time testing for all patients 43 and older May be repeated more frequently for patients with increased risk factors or exposure Gonorrhea, Chlamydia Every 12 months for all sexually active persons 13-24 years Additional monitoring may be recommended for those who are considered high risk or who have symptoms PSA Men 2-7 years old with risk factors Additional screening may be recommended from age 30-69 based on risk factors, symptoms, and history  Vaccine Recommendations Tetanus Booster All adults every 10 years Flu Vaccine All patients 6 months and older every year COVID Vaccine All patients 12 years and older Initial dosing with booster May recommend additional booster based on age and health history HPV Vaccine 2 doses all patients age 54-26 Dosing may be considered for patients over 26 Shingles Vaccine (Shingrix) 2 doses all adults 77 years and older Pneumonia (Pneumovax 23) All adults 29 years and older May recommend earlier dosing based on health history Pneumonia (Prevnar 52) All adults 61 years and older Dosed 1 year after Pneumovax 23  Additional Screening, Testing, and Vaccinations may be recommended on an individualized basis based on family history, health history, risk factors, and/or exposure.  __________________________________________________________  Diet Recommendations for All Patients  I recommend that all patients maintain a diet low in saturated fats, carbohydrates, and cholesterol. While this can be challenging at first, it is not impossible and small changes can make big differences.  Things to try: Decreasing the amount of soda, sweet tea, and/or juice to one or less per day and replace with water While water is always the first choice, if you do  not like water you may consider adding a water additive without sugar to improve the taste other sugar free drinks Replace potatoes with a brightly colored vegetable at dinner Use healthy oils, such as canola oil or olive oil, instead of butter or hard margarine Limit your bread intake to two pieces or less a day Replace regular pasta with low carb pasta options Bake, broil, or grill foods instead of frying Monitor portion sizes  Eat smaller, more frequent meals throughout the day instead of large meals  An important thing to remember is, if you love foods that are not great for your health, you don't have to give them up completely. Instead, allow these foods to be a reward when you have done well. Allowing yourself to still have special treats every once in a while is a nice way to tell yourself thank you for working hard to keep yourself healthy.   Also remember that every day is a new day. If  you have a bad day and "fall off the wagon", you can still climb right back up and keep moving along on your journey!  We have resources available to help you!  Some websites that may be helpful include: www.http://carter.biz/  Www.VeryWellFit.com _____________________________________________________________  Activity Recommendations for All Patients  I recommend that all adults get at least 20 minutes of moderate physical activity that elevates your heart rate at least 5 days out of the week.  Some examples include: Walking or jogging at a pace that allows you to carry on a conversation Cycling (stationary bike or outdoors) Water aerobics Yoga Weight lifting Dancing If physical limitations prevent you from putting stress on your joints, exercise in a pool or seated in a chair are excellent options.  Do determine your MAXIMUM heart rate for activity: YOUR AGE - 220 = MAX HeartRate   Remember! Do not push yourself too hard.  Start slowly and build up your pace, speed, weight, time in exercise,  etc.  Allow your body to rest between exercise and get good sleep. You will need more water than normal when you are exerting yourself. Do not wait until you are thirsty to drink. Drink with a purpose of getting in at least 8, 8 ounce glasses of water a day plus more depending on how much you exercise and sweat.    If you begin to develop dizziness, chest pain, abdominal pain, jaw pain, shortness of breath, headache, vision changes, lightheadedness, or other concerning symptoms, stop the activity and allow your body to rest. If your symptoms are severe, seek emergency evaluation immediately. If your symptoms are concerning, but not severe, please let us know so that we can recommend further evaluation.

## 2022-05-14 NOTE — Assessment & Plan Note (Signed)
No concerning findings present today. 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  No alarm sx present.  If you feel that your anxiety symptoms are worsening or becoming problematic, please reach out to me or OB so that we can find safe options for management during pregnancy.

## 2022-05-14 NOTE — Assessment & Plan Note (Signed)
No alarm sx present at this time. Gaining weight back after initial loss. Able to tolerate liquids and solids. No signs of dehydration at this time. Following closely with OB. Will continue to collaborate care. No alarm sx. Labs with OB completed.

## 2022-05-14 NOTE — Assessment & Plan Note (Signed)
Well controlled at this time with no alarm sx present. She is not on medication at this time due to pregnancy. 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 Paperwork signed and provided to patient today.  If you feel that your depression symptoms are returning, please let me or the OB know and we can work to find medication options that are safest during pregnancy to best help you manage.

## 2022-05-19 LAB — QUANTIFERON-TB GOLD PLUS
QuantiFERON Mitogen Value: >10 IU/mL
QuantiFERON Nil Value: 0 IU/mL
QuantiFERON TB1 Ag Value: 0 IU/mL
QuantiFERON TB2 Ag Value: 0 IU/mL
QuantiFERON-TB Gold Plus: NEGATIVE

## 2022-05-25 NOTE — Progress Notes (Signed)
All lab results normal. No changes to plan of care.

## 2022-05-28 ENCOUNTER — Telehealth: Payer: Self-pay | Admitting: Clinical

## 2022-05-28 NOTE — Telephone Encounter (Signed)
Call regarding referral; pt declines [facility] appointment at this time; agrees to call [facility] at [phone] as needed in the future.

## 2022-06-07 ENCOUNTER — Ambulatory Visit
Admission: EM | Admit: 2022-06-07 | Discharge: 2022-06-07 | Disposition: A | Discharge status: Home / Self Care | Payer: No Typology Code available for payment source | Attending: Internal Medicine | Admitting: Internal Medicine

## 2022-06-07 ENCOUNTER — Telehealth: Payer: Self-pay

## 2022-06-07 DIAGNOSIS — J069 Acute upper respiratory infection, unspecified: Secondary | ICD-10-CM | POA: Insufficient documentation

## 2022-06-07 DIAGNOSIS — R051 Acute cough: Secondary | ICD-10-CM | POA: Insufficient documentation

## 2022-06-07 DIAGNOSIS — J029 Acute pharyngitis, unspecified: Secondary | ICD-10-CM | POA: Insufficient documentation

## 2022-06-07 LAB — POCT RAPID STREP A (OFFICE): Rapid Strep A Screen: NEGATIVE

## 2022-06-07 MED ORDER — AMOXICILLIN 500 MG PO CAPS: 500.0000 mg | ORAL_CAPSULE | Freq: Two times a day (BID) | ORAL | 0 refills | Status: AC

## 2022-06-07 NOTE — ED Provider Notes (Signed)
[facility]    CSN: [id number] Arrival date & time: 06/07/22  0801      History   Chief Complaint Chief Complaint  Patient presents with   Sore Throat    HPI Lynn Moses is a 26 y.o. female.   Patient presents with headache, sinus pressure, nasal congestion, sore throat, cough that has been present for a little over a week.  Patient reports that she is 4 months pregnant so she has not taken any medications to help alleviate symptoms.  Denies any known sick contacts or fever.  Denies chest pain, shortness of breath, ear pain, nausea, vomiting, diarrhea, abdominal pain.  Patient reports that symptoms are worsening especially nasal congestion, and she reports that the mucus in her nose is becoming thicker.   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 Authorizing Provider  amoxicillin (AMOXIL) 500 MG capsule Take 1 capsule (500 mg total) by mouth 2 (two) times daily for 7 days. 06/07/22 06/14/22 Yes Tyree Fluharty, Michele Rockers, FNP  ondansetron (ZOFRAN-ODT) 4 MG disintegrating tablet Take 1 tablet (4 mg total) by mouth every 6 (six) hours as needed for nausea. Patient not taking: Reported on 05/06/2022 04/13/22    Tamala Julian, [place], [place]  prenatal vitamin w/FE, FA (PRENATAL 1 + 1) 27-1 MG TABS tablet Take 1 tablet by mouth daily at 12 noon.    [provider]  promethazine (PHENERGAN) 25 MG tablet Take 1 tablet (25 mg total) by mouth every 6 (six) hours as needed for nausea or vomiting. 03/29/22   Truett Mainland, DO    Family History Family History  Problem Relation Age of Onset   Varicose Veins Mother    Heart disease Maternal Grandmother    Hypertension Maternal Grandmother    Cancer Paternal Grandmother     Social History Social History   Tobacco Use   Smoking status: Former    Types: Cigarettes   Smokeless tobacco: Never  Vaping Use   Vaping Use: Never used  Substance Use Topics   Alcohol use: Not Currently   Drug use: Never     Allergies   Patient has no known allergies.   Review of Systems Review of Systems Per HPI  Physical Exam Triage Vital Signs ED Triage Vitals  Enc Vitals Group     BP 06/07/22 0831 111/77     Pulse Rate  06/07/22 0831 96     Resp 06/07/22 0831 18     Temp 06/07/22 0831 (!) 97.5 F (36.4 C)     Temp src --      SpO2 06/07/22 0831 98 %     Weight --      Height --      Head Circumference --      Peak Flow --      Pain Score 06/07/22 0830 5     Pain Loc --      Pain Edu? --      Excl. in [place]? --    No data found.  Updated Vital Signs BP 111/77   Pulse 96   Temp (!) 97.5 F (36.4 C)   Resp 18   LMP 01/26/2022 (Exact Date)   SpO2 98%   Visual Acuity Right Eye Distance:   Left Eye Distance:   Bilateral Distance:    Right Eye Near:   Left Eye Near:    Bilateral Near:     Physical Exam Constitutional:      General: She is not in acute distress.    Appearance: Normal appearance. She is not toxic-appearing or diaphoretic.  HENT:     Head: Normocephalic and atraumatic.     Right Ear: Tympanic membrane and ear canal normal.     Left Ear: Tympanic membrane and ear canal normal.     Nose: Congestion present.      Mouth/Throat:     Mouth: Mucous membranes are moist.     Pharynx: Posterior oropharyngeal erythema present.  Eyes:     Extraocular Movements: Extraocular movements intact.     Conjunctiva/sclera: Conjunctivae normal.     Pupils: Pupils are equal, round, and reactive to light.  Cardiovascular:     Rate and Rhythm: Normal rate and regular rhythm.     Pulses: Normal pulses.     Heart sounds: Normal heart sounds.  Pulmonary:     Effort: Pulmonary effort is normal. No respiratory distress.     Breath sounds: Normal breath sounds. No stridor. No wheezing, rhonchi or rales.  Abdominal:     General: Abdomen is flat. Bowel sounds are normal.     Palpations: Abdomen is soft.  Musculoskeletal:        General: Normal range of motion.     Cervical back: Normal range of motion.  Skin:    General: Skin is warm and dry.  Neurological:     General: No focal deficit present.     Mental Status: She is alert and oriented to person, place, and time. Mental status is at baseline.  Psychiatric:        Mood and Affect: Mood normal.        Behavior: Behavior normal.      UC Treatments / Results  Labs (all labs ordered are listed, but only abnormal results are displayed) Labs Reviewed  POCT RAPID STREP A (OFFICE)    EKG   Radiology No results found.  Procedures Procedures (including critical care time)  Medications Ordered in UC Medications - No data to display  Initial Impression / Assessment and Plan / UC Course  I have reviewed the triage vital signs and the nursing notes.  Pertinent labs & imaging results that were available during my care of the patient were reviewed by me and considered in my medical decision making (see chart for details).     Patient has acute upper respiratory infection but reports that symptoms are worsening.  Lung sounds are clear.  Due to worsening and persistent symptoms, I do think that antibiotics are warranted in pregnancy.  Will prescribe low-dose and  short course of amoxicillin given that it is safe in pregnancy.  Discussed safe medications to take in pregnancy for upper respiratory symptoms as well.  Patient to follow-up with OB/GYN or urgent care if symptoms persist or worsen.  Discussed return precautions.  Patient verbalized understanding and was agreeable with plan. Final Clinical Impressions(s) / UC Diagnoses   Final diagnoses:  Acute upper respiratory infection  Acute cough     Discharge Instructions      You have been prescribed antibiotic to treat upper respiratory infection.  Please follow-up with OB/GYN if symptoms persist or worsen for further recommendations.    ED Prescriptions     Medication Sig Dispense Auth. Provider   amoxicillin (AMOXIL) 500 MG capsule Take 1 capsule (500 mg total) by mouth 2 (two) times daily for 7 days. 14 capsule [place], Michele Rockers, [place]      PDMP not reviewed this encounter.   Teodora Medici, [place] 06/07/22 [phone]

## 2022-06-07 NOTE — Discharge Instructions (Signed)
You have been prescribed antibiotic to treat upper respiratory infection.  Please follow-up with OB/GYN if symptoms persist or worsen for further recommendations.

## 2022-06-07 NOTE — Telephone Encounter (Signed)
Spoke with patient - had to move Anatomy ultrasound out due to given the incorrect due date at time of scheduling.

## 2022-06-07 NOTE — ED Triage Notes (Signed)
Patient presents to Urgent Care with complaints of ha since last week, sinus pressure and new onset of sore throat and cough since yesterday. Patient reports 4 months pregnant and hasn't taken any new medication

## 2022-06-08 ENCOUNTER — Encounter: Payer: No Typology Code available for payment source | Admitting: Advanced Practice Midwife

## 2022-06-09 ENCOUNTER — Other Ambulatory Visit: Payer: Self-pay

## 2022-06-09 LAB — CULTURE, GROUP A STREP (THRC)

## 2022-06-10 ENCOUNTER — Ambulatory Visit: Payer: No Typology Code available for payment source

## 2022-06-14 ENCOUNTER — Ambulatory Visit: Payer: Self-pay

## 2022-06-14 NOTE — Telephone Encounter (Signed)
  Chief Complaint: yeast infection Symptoms: vaginal itching and discharge that's white creamy Frequency: today Pertinent Negatives: Patient denies pain with urination or bleeding Disposition: [] ED /[] Urgent Care (no appt availability in office) / [x] Appointment(In office/virtual)/ []  [facility] [facility]/ [] Home Care/ [] Refused Recommended Disposition /[] [facility]/ []  Follow-up with PCP Additional Notes: pt wanted to know if she can use monistat cream to help with the itching. I advised her that Monistat cream is safe and just to just the external cream but if she had to use the suppositories to make sure not to insert more than half way and not to use 1 day dose. Pt states she will get the cream and has OB appt tomorrow morning.   Summary: advice - yeast infection   Pt called the community line asking if she could use monistat cream while pregnant, pt stated she thinks she has a yeast infection.      Reason for Disposition  MODERATE-SEVERE itching (i.e., interferes with school, work, or sleep)  Answer Assessment - Initial Assessment Questions 1. SYMPTOM: "What's the main symptom you're concerned about?" (e.g., pain, itching, dryness)     Itching  2. LOCATION: "Where is the  sx located?" (e.g., inside/outside, left/right)     vaginally 3. ONSET: "When did the  sx  start?"     today 4. PAIN: "Is there any pain?" If Yes, ask: "How bad is it?" (Scale: 1-10; mild, moderate, severe)     No 5. ITCHING: "Is there any itching?" If Yes, ask: "How bad is it?" (Scale: 1-10; mild, moderate, severe)     Moderate 6. CAUSE: "What do you think is causing the discharge?" "Have you had the same problem before? What happened then?"     Yeast infection 7. OTHER SYMPTOMS: "Do you have any other symptoms?" (e.g., fever, itching, vaginal bleeding, pain with urination, injury to genital area, vaginal foreign body)     Discharge white and creamy  Protocols used: Vaginal Symptoms-A-AH

## 2022-06-15 ENCOUNTER — Other Ambulatory Visit (HOSPITAL_COMMUNITY)
Admission: RE | Admit: 2022-06-15 | Discharge: 2022-06-15 | Disposition: A | Discharge status: Home / Self Care | Payer: No Typology Code available for payment source | Source: Ambulatory Visit | Attending: Advanced Practice Midwife | Admitting: Advanced Practice Midwife

## 2022-06-15 ENCOUNTER — Encounter: Payer: Self-pay | Admitting: Advanced Practice Midwife

## 2022-06-15 ENCOUNTER — Ambulatory Visit (INDEPENDENT_AMBULATORY_CARE_PROVIDER_SITE_OTHER): Payer: No Typology Code available for payment source | Admitting: Advanced Practice Midwife

## 2022-06-15 VITALS — BP 105/60 | HR 79 | Wt 161.0 lb

## 2022-06-15 DIAGNOSIS — Z3A18 18 weeks gestation of pregnancy: Secondary | ICD-10-CM

## 2022-06-15 DIAGNOSIS — B379 Candidiasis, unspecified: Secondary | ICD-10-CM | POA: Insufficient documentation

## 2022-06-15 DIAGNOSIS — O98812 Other maternal infectious and parasitic diseases complicating pregnancy, second trimester: Secondary | ICD-10-CM | POA: Diagnosis present

## 2022-06-15 DIAGNOSIS — B3731 Acute candidiasis of vulva and vagina: Secondary | ICD-10-CM

## 2022-06-15 MED ORDER — TERCONAZOLE 0.4 % VA CREA: 1.0000 | TOPICAL_CREAM | Freq: Every day | VAGINAL | 2 refills | Status: AC

## 2022-06-15 NOTE — Progress Notes (Signed)
   PRENATAL VISIT NOTE  Subjective:  Lynn Moses is a 26 y.o. G2P1001 at [redacted]w[redacted]d being seen today for ongoing prenatal care.  She is currently monitored for the following issues for this low-risk pregnancy and has History of depression; Attention deficit disorder; Supervision of other normal pregnancy, antepartum; and History of anxiety on their problem list.  Patient reports vaginal irritation.  Contractions: Not present. Vag. Bleeding: None.  Movement: Present. Denies leaking of fluid.   The following portions of the patient's history were reviewed and updated as appropriate: allergies, current medications, past family history, past medical history, past social history, past surgical history and problem list.   Objective:   Vitals:   06/15/22 0818  BP: 105/60  Pulse: 79  Weight: 161 lb (73 kg)    Fetal Status:     Movement: Present     General:  Alert, oriented and cooperative. Patient is in no acute distress.  Skin: Skin is warm and dry. No rash noted.   Cardiovascular: Normal heart rate noted  Respiratory: Normal respiratory effort, no problems with respiration noted  Abdomen: Soft, gravid, appropriate for gestational age.  Pain/Pressure: Absent     Pelvic: Cervical exam deferred      Swab obtained  Extremities: Normal range of motion.  Edema: None  Mental Status: Normal mood and affect. Normal behavior. Normal judgment and thought content.   Assessment and Plan:  Pregnancy: G2P1001 at [redacted]w[redacted]d 1. [redacted] weeks gestation of pregnancy     Has Anatomy US scheduled 07/02/22  - AFP, Serum, Open Spina Bifida - Cervicovaginal ancillary only( [place])  2. Yeast vaginitis      Used Vagisil externally      Will Rx Terazole, discussed how to use - terconazole (TERAZOL 7) 0.4 % vaginal cream; Place 1 applicator vaginally at bedtime for 7 days.  Dispense: 45 g; Refill: 2  Preterm labor symptoms and general obstetric precautions including but not limited to vaginal bleeding,  contractions, leaking of fluid and fetal movement were reviewed in detail with the patient. Please refer to After Visit Summary for other counseling recommendations.   Return in about 4 weeks (around 07/13/2022) for [facility].  Future Appointments  Date Time Provider Department Center  07/02/2022  1:45 PM [facility] US5 [facility]  07/13/2022  8:35 AM Aviva Signs, CNM [facility] None  08/09/2022  8:15 AM Milas Hock, MD [facility] None  08/24/2022 11:15 AM Aviva Signs, CNM [facility] None  05/16/2023  8:10 AM Early, Sung Amabile, NP [facility]    Wynelle Bourgeois, CNM

## 2022-06-16 LAB — CERVICOVAGINAL ANCILLARY ONLY
Candida Glabrata: NEGATIVE
Candida Vaginitis: POSITIVE — AB
Comment: NEGATIVE
Comment: NEGATIVE

## 2022-06-17 LAB — AFP, SERUM, OPEN SPINA BIFIDA
AFP MoM: 1.11
AFP Value: 48.2 ng/mL
Gest. Age on Collection Date: 18.1 weeks
Maternal Age At EDD: 25.4 yr
OSBR Risk 1 IN: 8647
Test Results:: NEGATIVE
Weight: 161 [lb_av]

## 2022-07-02 ENCOUNTER — Ambulatory Visit: Payer: No Typology Code available for payment source | Attending: Advanced Practice Midwife

## 2022-07-02 DIAGNOSIS — O359XX0 Maternal care for (suspected) fetal abnormality and damage, unspecified, not applicable or unspecified: Secondary | ICD-10-CM

## 2022-07-02 DIAGNOSIS — Z3A20 20 weeks gestation of pregnancy: Secondary | ICD-10-CM | POA: Insufficient documentation

## 2022-07-02 DIAGNOSIS — Z363 Encounter for antenatal screening for malformations: Secondary | ICD-10-CM | POA: Insufficient documentation

## 2022-07-02 DIAGNOSIS — O358XX0 Maternal care for other (suspected) fetal abnormality and damage, not applicable or unspecified: Secondary | ICD-10-CM | POA: Diagnosis not present

## 2022-07-02 DIAGNOSIS — Z3480 Encounter for supervision of other normal pregnancy, unspecified trimester: Secondary | ICD-10-CM | POA: Insufficient documentation

## 2022-07-06 ENCOUNTER — Other Ambulatory Visit: Payer: No Typology Code available for payment source

## 2022-07-06 ENCOUNTER — Encounter: Payer: No Typology Code available for payment source | Admitting: Advanced Practice Midwife

## 2022-07-13 ENCOUNTER — Ambulatory Visit (INDEPENDENT_AMBULATORY_CARE_PROVIDER_SITE_OTHER): Payer: No Typology Code available for payment source | Admitting: Advanced Practice Midwife

## 2022-07-13 ENCOUNTER — Encounter: Payer: Self-pay | Admitting: Advanced Practice Midwife

## 2022-07-13 VITALS — BP 108/69 | HR 89 | Wt 165.0 lb

## 2022-07-13 DIAGNOSIS — Z3482 Encounter for supervision of other normal pregnancy, second trimester: Secondary | ICD-10-CM

## 2022-07-13 DIAGNOSIS — Z3480 Encounter for supervision of other normal pregnancy, unspecified trimester: Secondary | ICD-10-CM

## 2022-07-13 DIAGNOSIS — Z3A22 22 weeks gestation of pregnancy: Secondary | ICD-10-CM

## 2022-07-13 NOTE — Progress Notes (Signed)
   PRENATAL VISIT NOTE  Subjective:  Lynn Moses is a 26 y.o. G2P1001 at [redacted]w[redacted]d being seen today for ongoing prenatal care.  She is currently monitored for the following issues for this low-risk pregnancy and has History of depression; Attention deficit disorder; Supervision of other normal pregnancy, antepartum; and History of anxiety on their problem list.  Patient reports no complaints.  Contractions: Not present. Vag. Bleeding: None.  Movement: Present. Denies leaking of fluid.   The following portions of the patient's history were reviewed and updated as appropriate: allergies, current medications, past family history, past medical history, past social history, past surgical history and problem list.   Objective:   Vitals:   07/13/22 0830  BP: 108/69  Pulse: 89  Weight: 165 lb (74.8 kg)    Fetal Status: Fetal Heart Rate (bpm): 150   Movement: Present     General:  Alert, oriented and cooperative. Patient is in no acute distress.  Skin: Skin is warm and dry. No rash noted.   Cardiovascular: Normal heart rate noted  Respiratory: Normal respiratory effort, no problems with respiration noted  Abdomen: Soft, gravid, appropriate for gestational age.  Pain/Pressure: Absent     Pelvic: Cervical exam deferred        Extremities: Normal range of motion.  Edema: None  Mental Status: Normal mood and affect. Normal behavior. Normal judgment and thought content.   Assessment and Plan:  Pregnancy: G2P1001 at [redacted]w[redacted]d 1. Supervision of other normal pregnancy, antepartum     Yeast vaginitis resolved      Feels frequent fetal movement      [place] within normal limits  Preterm labor symptoms and general obstetric precautions including but not limited to vaginal bleeding, contractions, leaking of fluid and fetal movement were reviewed in detail with the patient. Please refer to After Visit Summary for other counseling recommendations.   Return in about 4 weeks (around 08/10/2022) for [facility].  Future Appointments  Date Time Provider Department Center  08/10/2022 10:55 AM Aviva Signs, CNM [facility] None  08/24/2022 11:15 AM Aviva Signs, CNM [facility] None  09/07/2022 11:15 AM Gerrit Heck, CNM [facility] None  09/21/2022 11:15 AM Aviva Signs, CNM [facility] None  10/05/2022 11:15 AM Aviva Signs, CNM [facility] None  10/19/2022 11:15 AM Aviva Signs, CNM [facility] None  10/26/2022 11:15 AM Aviva Signs, CNM [facility] None  05/16/2023  8:10 AM Early, Sung Amabile, NP [facility]    Wynelle Bourgeois, CNM

## 2022-07-18 IMAGING — US US OB < 14 WEEKS - US OB TV
2 series · 15 of 28 positions shown · non-contrast
Comparison: Abdomen and pelvis MRI 08/07/2020.

CLINICAL DATA: 25-year-old female with abdominal pain in the 1st
trimester of pregnancy.

EXAM:
OBSTETRIC <14 WK US AND TRANSVAGINAL OB US
TECHNIQUE: Both transabdominal and transvaginal ultrasound examinations were
performed for complete evaluation of the gestation as well as the
maternal uterus, adnexal regions, and pelvic cul-de-sac.
Transvaginal technique was performed to assess early pregnancy.

[Series 1: us ob < 14 weeks - us ob tv · 14 of 63 slices shown (1 of 2)]
[im 1/63]
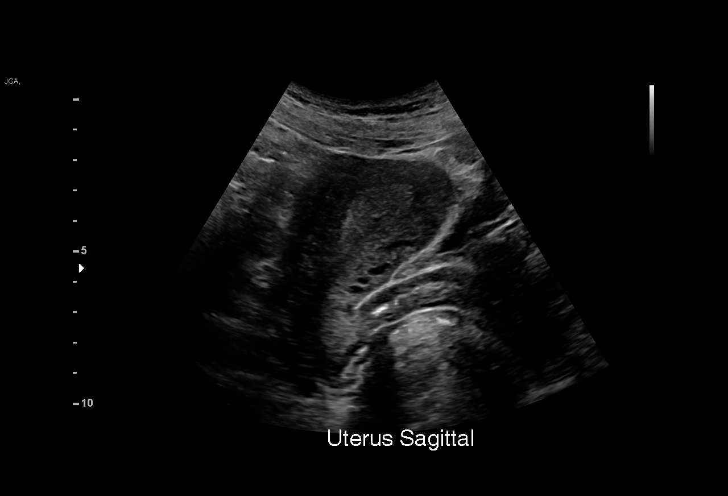
[im 5/63]
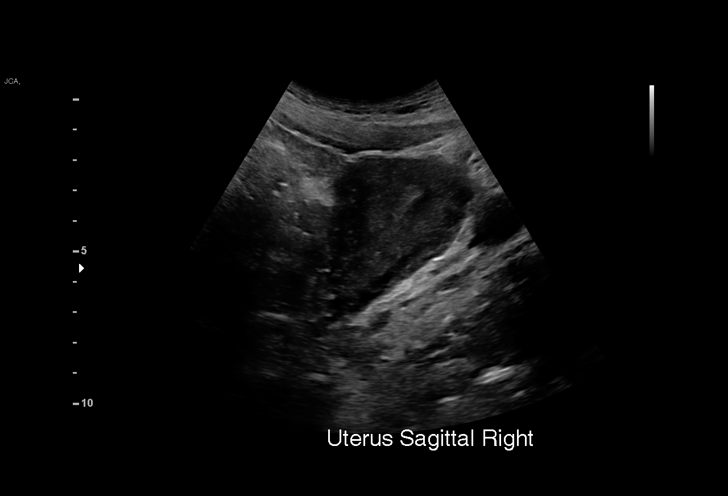
[im 10/63]
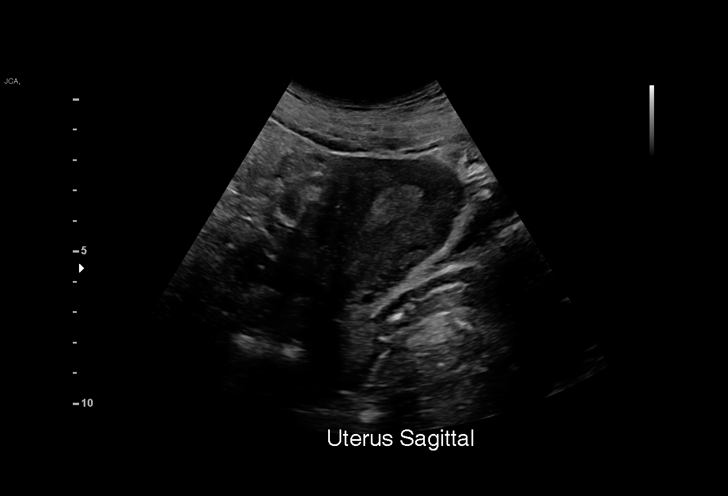
[im 15/63]
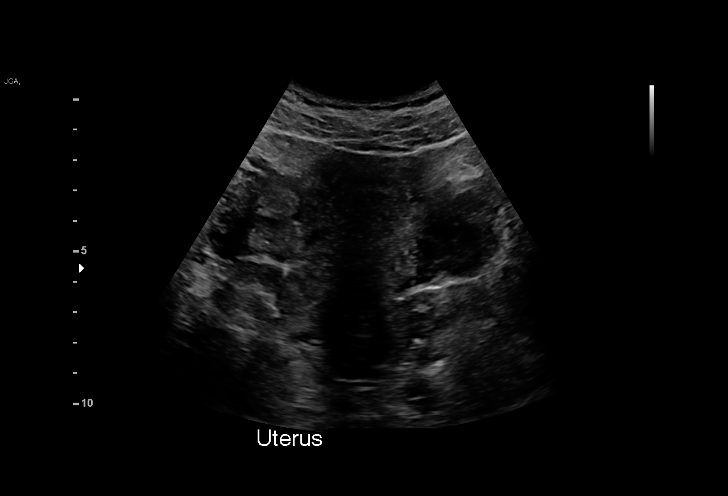
[im 20/63]
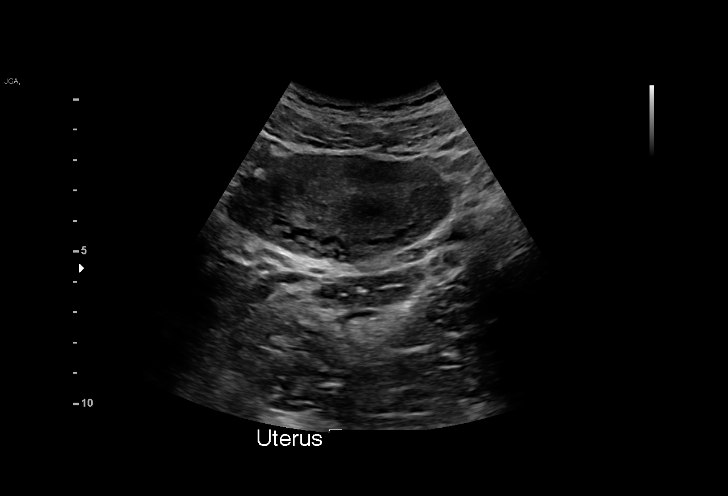
[im 24/63]
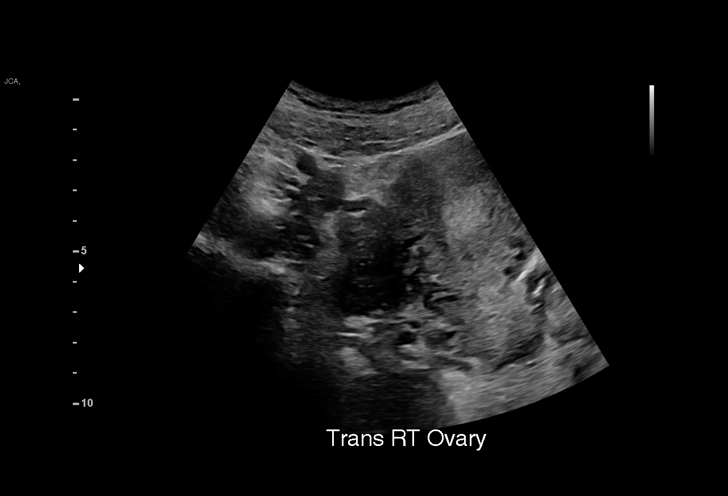
[im 29/63]
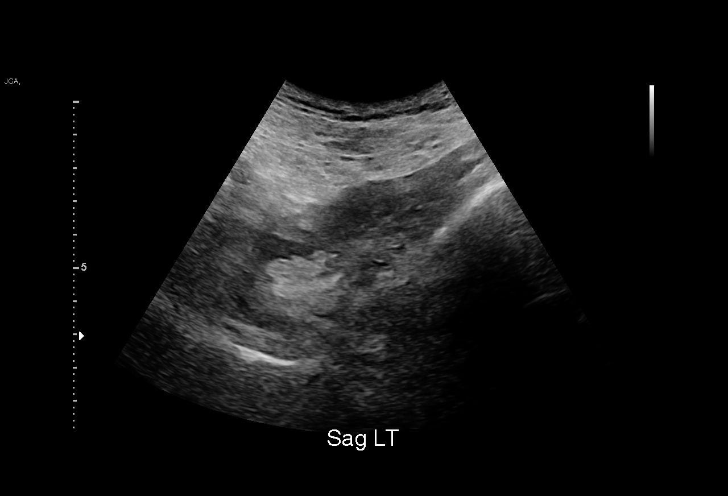
[im 34/63]
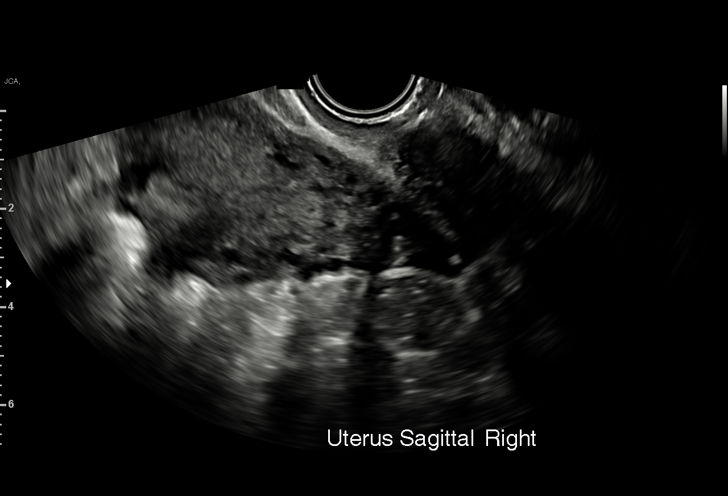
[im 36/63]
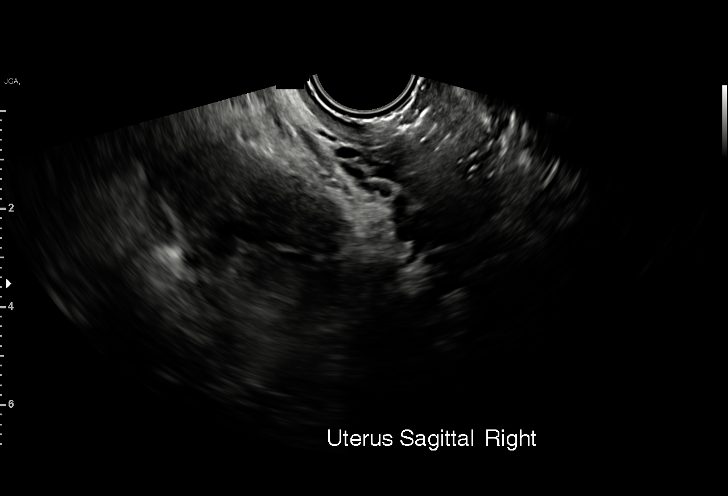
[im 41/63]
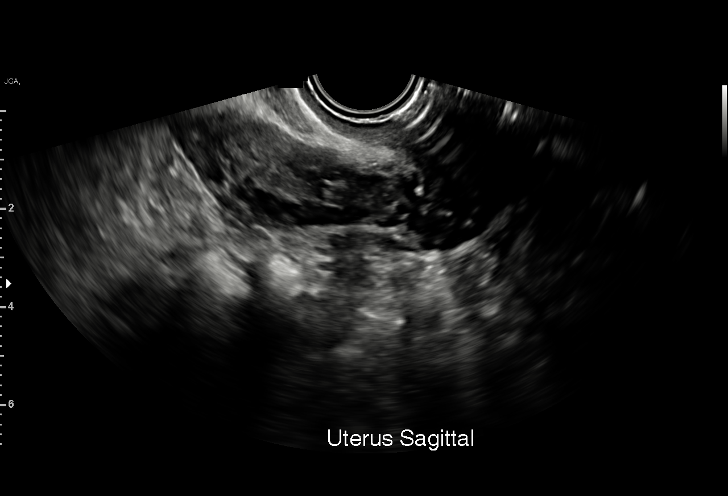
[im 46/63]
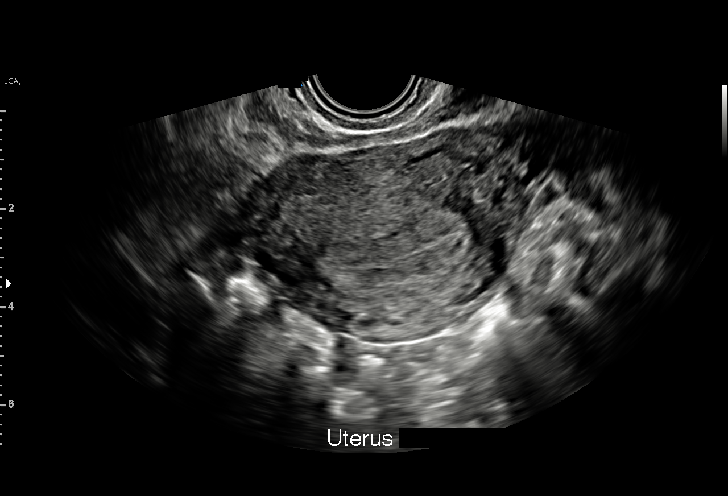
[im 51/63]
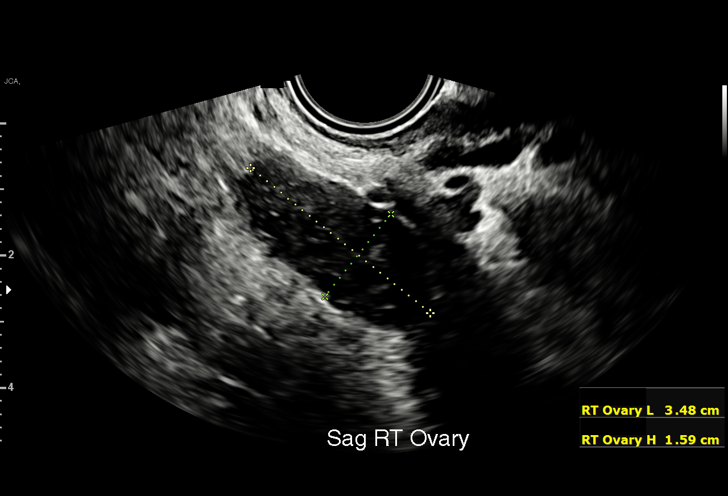
[im 55/63]
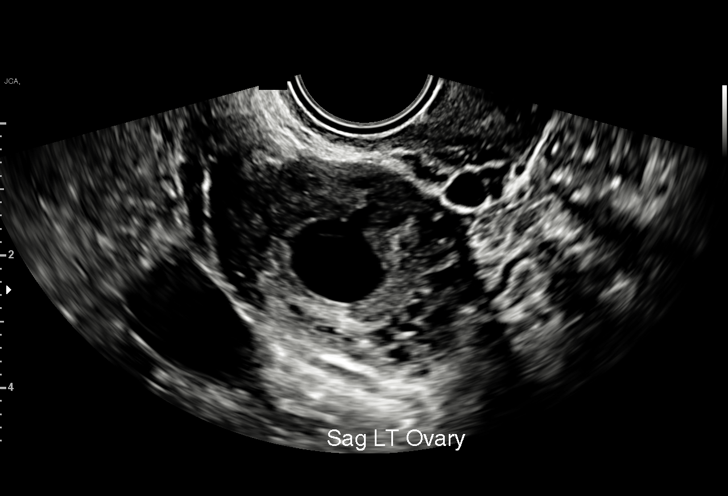
[im 60/63]
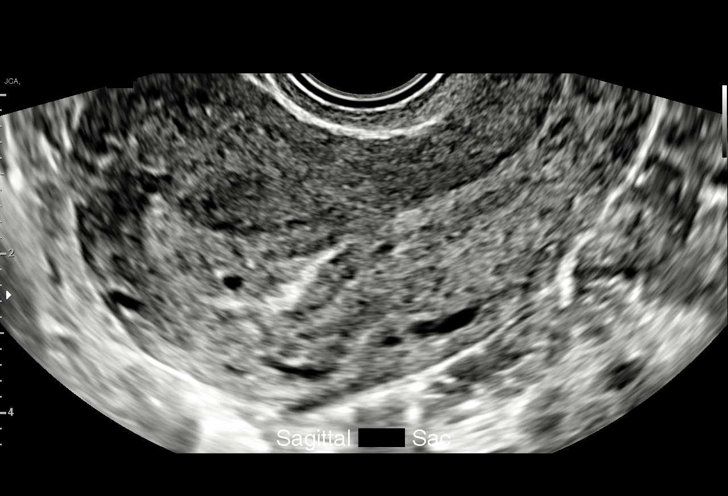

[Series 3: us ob < 14 weeks - us ob tv · 1 of 1 slices shown (2 of 2)]
[im 1/1]
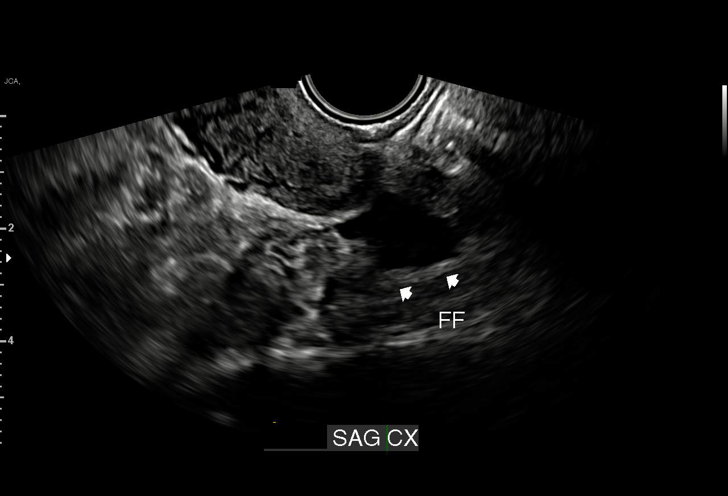

[15 of 28 positions shown; findings below may reference images not displayed]

FINDINGS: Intrauterine gestational sac: Questionable small endometrial
gestational sac near the uterine fundus (image 63).

Yolk sac:  Not visible

Embryo:  Not visible

Cardiac Activity: Not applicable

MSD: 2.6 mm   4 w   6 d

Subchorionic hemorrhage:  None visualized.

Maternal uterus/adnexae: The right ovary appears normal measuring
3.5 x 1.6 x 2.9 cm. The left ovary is larger and likely contains the
corpus luteum (image 59). The left ovary is 4.1 x 2.9 x 3.3 cm.
Small volume simple appearing free fluid in the cul-de-sac (series
3).
IMPRESSION: 1. Possible early intrauterine gestational sac, but no yolk sac,
fetal pole, or cardiac activity yet visualized.
Recommend follow-up quantitative B-HCG levels and follow-up US in 14
days to confirm and assess viability. This recommendation follows
SRU consensus guidelines: Diagnostic Criteria for Nonviable
Pregnancy Early in the First Trimester. N Engl J Med 7431;
2. Suspected left ovarian corpus luteum.  Right ovary is normal.

## 2022-08-09 ENCOUNTER — Encounter: Payer: No Typology Code available for payment source | Admitting: Obstetrics and Gynecology

## 2022-08-10 ENCOUNTER — Encounter: Payer: Self-pay | Admitting: General Practice

## 2022-08-10 ENCOUNTER — Ambulatory Visit (INDEPENDENT_AMBULATORY_CARE_PROVIDER_SITE_OTHER): Payer: No Typology Code available for payment source | Admitting: Advanced Practice Midwife

## 2022-08-10 VITALS — BP 98/63 | HR 81 | Wt 173.0 lb

## 2022-08-10 DIAGNOSIS — Z23 Encounter for immunization: Secondary | ICD-10-CM | POA: Diagnosis not present

## 2022-08-10 DIAGNOSIS — Z3482 Encounter for supervision of other normal pregnancy, second trimester: Secondary | ICD-10-CM | POA: Diagnosis not present

## 2022-08-10 DIAGNOSIS — Z3A26 26 weeks gestation of pregnancy: Secondary | ICD-10-CM

## 2022-08-10 DIAGNOSIS — Z3480 Encounter for supervision of other normal pregnancy, unspecified trimester: Secondary | ICD-10-CM

## 2022-08-10 NOTE — Addendum Note (Signed)
Addended by: Mikey Bussing on: 08/10/2022 08:37 AM   Modules accepted: Orders

## 2022-08-10 NOTE — Progress Notes (Signed)
   PRENATAL VISIT NOTE  Subjective:  Lynn Moses is a 26 y.o. G2P1001 at [redacted]w[redacted]d being seen today for ongoing prenatal care.  She is currently monitored for the following issues for this low-risk pregnancy and has History of depression; Attention deficit disorder; Supervision of other normal pregnancy, antepartum; and History of anxiety on their problem list.  Patient reports  intermittent low back pain .  Contractions: Not present. Vag. Bleeding: None.  Movement: Present. Denies leaking of fluid.   The following portions of the patient's history were reviewed and updated as appropriate: allergies, current medications, past family history, past medical history, past social history, past surgical history and problem list.   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 Patient is in no acute distress.  Skin: Skin is warm and dry. No rash noted.   Cardiovascular: Normal heart rate noted  Respiratory: Normal respiratory effort, no problems with respiration noted  Abdomen: Soft, gravid, appropriate for gestational age.  Pain/Pressure: Present     Pelvic: Cervical exam deferred        Extremities: Normal range of motion.  Edema: None  Mental Status: Normal mood and affect. Normal behavior. Normal judgment and thought content.   Assessment and Plan:  Pregnancy: G2P1001 at [redacted]w[redacted]d 1. Supervision of other normal pregnancy, antepartum  - CBC - Glucose Tolerance, 2 Hours w/1 Hour - HIV Antibody (routine testing w rflx) - RPR  2. [redacted] weeks gestation of pregnancy     Glucola today      TDAP today  Preterm labor symptoms and general obstetric precautions including but not limited to vaginal bleeding, contractions, leaking of fluid and fetal movement were reviewed in detail with the patient. Please  refer to After Visit Summary for other counseling recommendations.     Future Appointments  Date Time Provider Department Center  08/10/2022 10:55 AM Aviva Signs, CNM [facility] None  08/24/2022 11:15 AM Aviva Signs, CNM [facility] None  09/07/2022 11:15 AM Gerrit Heck, CNM [facility] None  09/21/2022 11:15 AM Aviva Signs, CNM [facility] None  10/05/2022 11:15 AM Aviva Signs, CNM [facility] None  10/19/2022 11:15 AM Aviva Signs, CNM [facility] None  10/26/2022 11:15 AM Aviva Signs, CNM [facility] None  05/16/2023  8:10 AM Early, Sung Amabile, NP [facility]    Wynelle Bourgeois, CNM

## 2022-08-11 LAB — CBC
Hematocrit: 37.4 % (ref 34.0–46.6)
Hemoglobin: 12.2 g/dL (ref 11.1–15.9)
MCH: 28 pg (ref 26.6–33.0)
MCHC: 32.6 g/dL (ref 31.5–35.7)
MCV: 86 fL (ref 79–97)
Platelets: 235 10*3/uL (ref 150–450)
RBC: 4.35 x10E6/uL (ref 3.77–5.28)
RDW: 12.8 % (ref 11.7–15.4)
WBC: 9.9 10*3/uL (ref 3.4–10.8)

## 2022-08-11 LAB — GLUCOSE TOLERANCE, 2 HOURS W/ 1HR
Glucose, 1 hour: 74 mg/dL (ref 70–179)
Glucose, 2 hour: 62 mg/dL — ABNORMAL LOW (ref 70–152)
Glucose, Fasting: 71 mg/dL (ref 70–91)

## 2022-08-11 LAB — RPR: RPR Ser Ql: NONREACTIVE

## 2022-08-11 LAB — HIV ANTIBODY (ROUTINE TESTING W REFLEX): HIV Screen 4th Generation wRfx: NONREACTIVE

## 2022-08-24 ENCOUNTER — Inpatient Hospital Stay (HOSPITAL_COMMUNITY)
Admission: AD | Admit: 2022-08-24 | Discharge: 2022-08-25 | DRG: 833 | Disposition: A | Discharge status: Home / Self Care | Payer: No Typology Code available for payment source | Attending: Obstetrics and Gynecology | Admitting: Obstetrics and Gynecology

## 2022-08-24 ENCOUNTER — Encounter (HOSPITAL_COMMUNITY): Payer: Self-pay | Admitting: Obstetrics & Gynecology

## 2022-08-24 ENCOUNTER — Other Ambulatory Visit: Payer: Self-pay

## 2022-08-24 ENCOUNTER — Ambulatory Visit (INDEPENDENT_AMBULATORY_CARE_PROVIDER_SITE_OTHER): Payer: No Typology Code available for payment source | Admitting: Advanced Practice Midwife

## 2022-08-24 ENCOUNTER — Encounter: Payer: Self-pay | Admitting: Advanced Practice Midwife

## 2022-08-24 VITALS — BP 103/66 | HR 74 | Wt 176.0 lb

## 2022-08-24 DIAGNOSIS — Z87891 Personal history of nicotine dependence: Secondary | ICD-10-CM

## 2022-08-24 DIAGNOSIS — O4700 False labor before 37 completed weeks of gestation, unspecified trimester: Secondary | ICD-10-CM

## 2022-08-24 DIAGNOSIS — Z3A28 28 weeks gestation of pregnancy: Secondary | ICD-10-CM

## 2022-08-24 DIAGNOSIS — Z3480 Encounter for supervision of other normal pregnancy, unspecified trimester: Principal | ICD-10-CM

## 2022-08-24 DIAGNOSIS — O4703 False labor before 37 completed weeks of gestation, third trimester: Secondary | ICD-10-CM

## 2022-08-24 DIAGNOSIS — O2613 Low weight gain in pregnancy, third trimester: Secondary | ICD-10-CM

## 2022-08-24 DIAGNOSIS — O368130 Decreased fetal movements, third trimester, not applicable or unspecified: Secondary | ICD-10-CM

## 2022-08-24 LAB — URINALYSIS, ROUTINE W REFLEX MICROSCOPIC
Bilirubin Urine: NEGATIVE
Glucose, UA: NEGATIVE mg/dL
Hgb urine dipstick: NEGATIVE
Ketones, ur: NEGATIVE mg/dL
Leukocytes,Ua: NEGATIVE
Nitrite: NEGATIVE
Protein, ur: NEGATIVE mg/dL
Specific Gravity, Urine: 1.009 (ref 1.005–1.030)
pH: 7 (ref 5.0–8.0)

## 2022-08-24 LAB — CBC
HCT: 33.6 % — ABNORMAL LOW (ref 36.0–46.0)
Hemoglobin: 11.7 g/dL — ABNORMAL LOW (ref 12.0–15.0)
MCH: 28.8 pg (ref 26.0–34.0)
MCHC: 34.8 g/dL (ref 30.0–36.0)
MCV: 82.8 fL (ref 80.0–100.0)
Platelets: 228 10*3/uL (ref 150–400)
RBC: 4.06 MIL/uL (ref 3.87–5.11)
RDW: 12.8 % (ref 11.5–15.5)
WBC: 13.5 10*3/uL — ABNORMAL HIGH (ref 4.0–10.5)
nRBC: 0 % (ref 0.0–0.2)

## 2022-08-24 LAB — POCT FERN TEST: POCT Fern Test: NEGATIVE

## 2022-08-24 LAB — TYPE AND SCREEN
ABO/RH(D): A POS
Antibody Screen: NEGATIVE

## 2022-08-24 LAB — FETAL FIBRONECTIN: Fetal Fibronectin: POSITIVE — AB

## 2022-08-24 MED ORDER — NIFEDIPINE ER OSMOTIC RELEASE 30 MG PO TB24
30.0000 mg | ORAL_TABLET | Freq: Two times a day (BID) | ORAL | Status: DC
  Administered 2022-08-24 – 2022-08-25 (×2): 30 mg via ORAL
  Filled 2022-08-24 (×2): qty 1

## 2022-08-24 MED ORDER — LACTATED RINGERS IV SOLN
INTRAVENOUS | Status: DC
Start: 2022-08-24 — End: 2022-08-26

## 2022-08-24 MED ORDER — ACETAMINOPHEN 325 MG PO TABS: 650.0000 mg | ORAL_TABLET | ORAL | Status: DC | PRN

## 2022-08-24 MED ORDER — BETAMETHASONE SOD PHOS & ACET 6 (3-3) MG/ML IJ SUSP
12.0000 mg | INTRAMUSCULAR | Status: AC
  Administered 2022-08-24 – 2022-08-25 (×2): 12 mg via INTRAMUSCULAR
  Filled 2022-08-24: qty 5

## 2022-08-24 MED ORDER — DOCUSATE SODIUM 100 MG PO CAPS
100.0000 mg | ORAL_CAPSULE | Freq: Every day | ORAL | Status: DC
  Administered 2022-08-24: 100 mg via ORAL
  Filled 2022-08-24 (×2): qty 1

## 2022-08-24 MED ORDER — TERBUTALINE SULFATE 1 MG/ML IJ SOLN
0.2500 mg | Freq: Once | INTRAMUSCULAR | Status: AC
  Administered 2022-08-24: 0.25 mg via SUBCUTANEOUS
  Filled 2022-08-24: qty 1

## 2022-08-24 MED ORDER — LACTATED RINGERS IV BOLUS
1000.0000 mL | Freq: Once | INTRAVENOUS | Status: AC
  Administered 2022-08-24: 1000 mL via INTRAVENOUS

## 2022-08-24 MED ORDER — LACTATED RINGERS IV BOLUS
1000.0000 mL | Freq: Once | INTRAVENOUS | Status: AC
Start: 2022-08-24 — End: 2022-08-24
  Administered 2022-08-24: 1000 mL via INTRAVENOUS

## 2022-08-24 MED ORDER — NIFEDIPINE 10 MG PO CAPS
10.0000 mg | ORAL_CAPSULE | ORAL | Status: DC | PRN
  Administered 2022-08-24 (×2): 10 mg via ORAL
  Filled 2022-08-24 (×3): qty 1

## 2022-08-24 MED ORDER — PRENATAL MULTIVITAMIN CH
1.0000 | ORAL_TABLET | Freq: Every day | ORAL | Status: DC
  Filled 2022-08-24: qty 1

## 2022-08-24 MED ORDER — CALCIUM CARBONATE ANTACID 500 MG PO CHEW: 2.0000 | CHEWABLE_TABLET | ORAL | Status: DC | PRN

## 2022-08-24 NOTE — MAU Note (Signed)
Lynn Moses is a 26 y.o. at [redacted]w[redacted]d here in MAU reporting: she was sent from [facility] office secondary having ctxs 3 minutes apart.  Denies VB, unsure if LOF.  Reports had fluid on underwear.  States has felt FM this morning, less than usual. LMP: N/A Onset of complaint: today Pain score: 0 Vitals:   08/24/22 1256  BP: 109/66  Pulse: 88  Resp: 18  Temp: 97.8 F (36.6 C)  SpO2: 100%     FHT:159 bpm Lab orders placed from triage:  UA

## 2022-08-24 NOTE — Progress Notes (Signed)
   PRENATAL VISIT NOTE  Subjective:  Lynn Moses is a 26 y.o. G2P1001 at [redacted]w[redacted]d being seen today for ongoing prenatal care.  She is currently monitored for the following issues for this low-risk pregnancy and has History of depression; Attention deficit disorder; Supervision of other normal pregnancy, antepartum; and History of anxiety on their problem list.  Patient reports  Decreased fetal movement for the past two days. Is feeling some contractions.  Worried about her weight gain, only 6lbs since pre=pregnancy .  Contractions: Not present. Vag. Bleeding: None.  Movement: Present. Denies leaking of fluid.   The following portions of the patient's history were reviewed and updated as appropriate: allergies, current medications, past family history, past medical history, past social history, past surgical history and problem list.   Objective:   Vitals:   08/24/22 1107  BP: 103/66  Pulse: 74  Weight: 176 lb (79.8 kg)    Fetal Status: Fetal Heart Rate (bpm): 145   Movement: Present     General:  Alert, oriented and cooperative. Patient is in no acute distress.  Skin: Skin is warm and dry. No rash noted.   Cardiovascular: Normal heart rate noted  Respiratory: Normal respiratory effort, no problems with respiration noted  Abdomen: Soft, gravid, appropriate for gestational age.  Pain/Pressure: Absent     Pelvic: Cervical exam deferred        Extremities: Normal range of motion.  Edema: None  Mental Status: Normal mood and affect. Normal behavior. Normal judgment and thought content.   Placed on EFM after visit with reassuring FHR for gestational age Noted to be having regular contractions, every 3 minutes States "feel them a little"  Assessment and Plan:  Pregnancy: G2P1001 at [redacted]w[redacted]d 1. Supervision of other normal pregnancy, antepartum      Glucola normal  - [place] MFM OB FOLLOW UP; Future  2. Poor weight gain of pregnancy, third trimester      Ordered growth [place] - [place] MFM OB FOLLOW  UP; Future  3. Decreased fetal movements in third trimester, single or unspecified fetus      NST reassuring for gestational age with frequent contractions  - [place] MFM OB FOLLOW UP; Future - Fetal nonstress test  4. Preterm uterine contractions     Sent to MAU for eval and treatment of preterm contractions  Preterm labor symptoms and general obstetric precautions including but not limited to vaginal bleeding, contractions, leaking of fluid and fetal movement were reviewed in detail with the patient. Please refer to After Visit Summary for other counseling recommendations.     Future Appointments  Date Time Provider Department Center  09/07/2022 11:15 AM Gerrit Heck, CNM [facility] None  09/21/2022 11:15 AM Aviva Signs, CNM [facility] None  10/05/2022 11:15 AM Aviva Signs, CNM [facility] None  10/19/2022 11:15 AM Aviva Signs, CNM [facility] None  10/26/2022 11:15 AM Aviva Signs, CNM [facility] None  05/16/2023  8:10 AM Early, Sung Amabile, NP [facility]    Wynelle Bourgeois, CNM

## 2022-08-24 NOTE — H&P (Signed)
History     CSN: [id number]  Arrival date and time: 08/24/22 1239   Event Date/Time   First Provider Initiated Contact with Patient 08/24/22 1318      Chief Complaint  Patient presents with   Contractions   Lynn Moses, a  26 y.o. G2P1001 at [redacted]w[redacted]d presents to MAU after being Sent over from the office for rule out Preterm Labor. Patient was having contractions regularly every 3 mins during her NST today. Patient states she "did not know she was contracting." Had been feeling tightness and thinking it was from running around with toddler but is not feeling pain. States last intercourse was 1 week ago. States she "does not think she is leaking", but notes an increase in "wetness" in her underwear." She believes to be sweat. She denies nausea and vomiting.  Denies other vaginal or urinary symptoms. Endorses +FM.    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   Ovarian cyst 01/2020    Past Surgical History:  Procedure Laterality Date   NO PAST SURGERIES      Family History  Problem Relation Age of Onset   Varicose Veins Mother    Heart disease Maternal Grandmother    Hypertension Maternal Grandmother    Cancer Paternal Grandmother     Social History   Tobacco Use   Smoking status: Former    Types: Cigarettes   Smokeless tobacco: Never  Vaping Use   Vaping Use: Never used  Substance Use Topics   Alcohol use: Not Currently   Drug use: Never    Allergies: No Known Allergies  Medications Prior to Admission  Medication Sig Dispense Refill Last Dose   prenatal vitamin w/FE, FA (PRENATAL 1 + 1) 27-1 MG TABS tablet Take 1 tablet by mouth daily at  12 noon.   Past Week    Review of Systems  Constitutional:  Negative for chills, fatigue and fever.  Eyes:  Negative for pain and visual disturbance.  Respiratory:  Negative for apnea, shortness of breath and wheezing.   Cardiovascular:  Negative for chest pain and palpitations.  Gastrointestinal:  Negative for abdominal pain, constipation, diarrhea, nausea and vomiting.  Genitourinary:  Positive for vaginal discharge. Negative for difficulty urinating, dysuria, pelvic pain, vaginal bleeding and vaginal pain.  Musculoskeletal:  Negative for back pain.  Neurological:  Negative for seizures, weakness and headaches.  Psychiatric/Behavioral:  Negative for suicidal ideas.    Physical Exam   Blood pressure 115/72, pulse 80, temperature 97.8 F (36.6 C), temperature source Oral, resp. rate 18, height 5\' 6"  (1.676 m), weight 80.3 kg, last menstrual period 01/26/2022, SpO2 100 %, not currently breastfeeding.  Physical Exam Vitals and nursing note reviewed. Exam conducted with a chaperone present.  Constitutional:      General: She is not in acute distress.    Appearance: Normal appearance. She is not ill-appearing.  HENT:     Head: Normocephalic.  Cardiovascular:     Rate and Rhythm: Normal rate and regular rhythm.  Pulmonary:     Effort: Pulmonary effort is normal.     Breath sounds: Normal breath sounds.  Abdominal:     Palpations: Abdomen is soft.  Genitourinary:    General: Normal vulva.     Vagina: Vaginal discharge present.  Musculoskeletal:        General: Normal range of motion.     Cervical back: Normal range of motion.  Skin:    General: Skin is warm and dry.  Neurological:     Mental Status: She is alert and oriented to person, place, and time.  Psychiatric:        Mood and Affect: Mood normal.    Dilation: Fingertip Effacement (%): Thick Station: Ballotable Exam by:: Dorathy Daft, CNM Outer os is 2 cm    FHT: 150 bpm with moderate variability. Appropriate for  gestational age.  Toco: Contractions every 3-4 minutes.  MAU Course  Procedures Orders Placed This Encounter  Procedures   Urinalysis, Routine w reflex microscopic Urine, Clean Catch   Fetal fibronectin   Fern Test   Insert peripheral IV   Meds ordered this encounter  Medications   NIFEdipine (PROCARDIA) capsule 10 mg   lactated ringers bolus 1,000 mL   lactated ringers bolus 1,000 mL   terbutaline (BRETHINE) injection 0.25 mg   Results for orders placed or performed during the hospital encounter of 08/24/22 (from the past 24 hour(s))  Urinalysis, Routine w reflex microscopic Urine, Clean Catch     Status: Abnormal   Collection Time: 08/24/22  1:06 PM  Result Value Ref Range   Color, Urine YELLOW YELLOW   APPearance CLOUDY (A) CLEAR   Specific Gravity, Urine 1.009 1.005 - 1.030   pH 7.0 5.0 - 8.0   Glucose, UA NEGATIVE NEGATIVE mg/dL   Hgb urine dipstick NEGATIVE NEGATIVE   Bilirubin Urine NEGATIVE NEGATIVE   Ketones, ur NEGATIVE NEGATIVE mg/dL   Protein, ur NEGATIVE NEGATIVE mg/dL   Nitrite NEGATIVE NEGATIVE   Leukocytes,Ua NEGATIVE NEGATIVE  Fetal fibronectin     Status: Abnormal   Collection Time: 08/24/22  2:32 PM  Result Value Ref Range   Fetal Fibronectin POSITIVE (A) NEGATIVE  Fern Test     Status: None   Collection Time: 08/24/22  2:47 PM  Result Value Ref Range   POCT Fern Test Negative = intact amniotic membranes      MDM Lab results reviewed and interpreted by me   - UA cloudy otherwise normal.  - Procardia x3 ordered  - FFN POSITIVE  - Consulted  Dr. Debroah Loop MAU attending. Discussed patient presentation, and efforts to stop contractions. Per MD watch for a few hours, see if contractions improve with management efforts and recheck cervix. Then develop a plan of care from there.    - Reassessment @ 1630 Patient continues to contract every 3 minutes despite Procardia and IV fluids. Another LR Bolus and Terb ordered and given.    - Reassessment @ 6:18 PM-  Contractions spaced out to every 6-9 mins and patient no longer feeling them "as strong as before."  Cervical exam Dilation: 1 Effacement (%): Thick Station: Ballotable Exam by:: Dorathy Daft, CNM  - Consulted Dr. Jolayne Panther on recommendation for overnight observation. Discussed patient presentation and current clinical picture with cervical change. MD recommends admission to [facility] speciality care, and overnight observation for Preterm Labor.   - Patient counseled  on BMZ injection for lung development. Risk and Benefits discussed. Patient with like to confer with FOB and then decide. FOB on the way.       Assessment and Plan  -Admit to [facility] for overnight Observation.  - Dr. Jolayne Panther to place admit orders    Claudette Head, MSN, CNM  08/24/2022, 1:18 PM

## 2022-08-25 DIAGNOSIS — Z3A28 28 weeks gestation of pregnancy: Secondary | ICD-10-CM

## 2022-08-25 LAB — RPR: RPR Ser Ql: NONREACTIVE

## 2022-08-25 MED ORDER — NIFEDIPINE ER 30 MG PO TB24: 30.0000 mg | ORAL_TABLET | Freq: Two times a day (BID) | ORAL | 0 refills | Status: DC

## 2022-08-25 NOTE — Discharge Summary (Signed)
Patient ID: Lynn Moses MRN: [id number] DOB/AGE: [birth year] 25 y.o.  Admit date: 08/24/2022 Discharge date: 08/25/2022  Admission Diagnoses:[redacted] weeks gestation preterm labor  Discharge Diagnoses: same  Prenatal Procedures: tocolysis  Consults: none  Hospital Course:  This is a 26 y.o. G2P1001 with IUP at [redacted]w[redacted]d admitted for threatened preterm labor. She presented to MAU after being sent over from the office for rule out preterm labor. Patient was having contractions regularly every 3 mins during her NST today. Patient states she "did not know she was contracting She was admitted with contractions, noted to have a cervical exam of 1 cm dilated.  No leaking of fluid and no bleeding.  She was initially started on Procardia. She was observed, fetal heart rate monitoring remained reassuring, and she had no signs/symptoms of progressing preterm labor or other maternal-fetal concerns.  Her cervical exam was unchanged from admission.  She was deemed stable for discharge to home with outpatient follow up.  Discharge Exam: Temp:  [97.7 F (36.5 C)-98.3 F (36.8 C)] 98.3 F (36.8 C) (08/30 1251) Pulse Rate:  [83-86] 83 (08/30 1251) Resp:  [14-16] 14 (08/30 1251) BP: (92-131)/(42-67) 92/42 (08/30 1251) SpO2:  [99 %-100 %] 100 % (08/30 1251) Physical Examination: CONSTITUTIONAL: Well-developed, well-nourished female in no acute distress.  HENT:  Normocephalic, atraumatic, External right and left ear normal. Oropharynx is clear and moist EYES: Conjunctivae and EOM are normal. Pupils are equal, round, and reactive to light. No scleral icterus.  NECK: Normal range of motion, supple, no masses SKIN: Skin is warm and dry. No rash noted. Not diaphoretic. No erythema. No pallor. NEUROLGIC: Alert and oriented to person, place, and time. Normal reflexes, muscle tone coordination. No cranial nerve deficit noted. PSYCHIATRIC: Normal mood and affect. Normal behavior. Normal judgment and thought  content. CARDIOVASCULAR: Normal heart rate noted, regular rhythm RESPIRATORY: Effort and breath sounds normal, no problems with respiration noted MUSCULOSKELETAL: Normal range of motion. No edema and no tenderness. 2+ distal pulses. ABDOMEN: Soft, nontender, nondistended, gravid. CERVIX: Dilation: Fingertip Effacement (%): Thick Station: Ballotable Exam by:: Dr. Debroah Loop 145 bpm Fetal monitoring: FHR: 145 bpm, Variability: moderate, Accelerations: Present, Decelerations: Absent  Uterine activity: 1-2 contractions per hour  Significant Diagnostic Studies:  Results for orders placed or performed during the hospital encounter of 08/24/22 (from the past 168 hour(s))  Urinalysis, Routine w reflex microscopic Urine, Clean Catch   Collection Time: 08/24/22  1:06 PM  Result Value Ref Range   Color, Urine YELLOW YELLOW   APPearance CLOUDY (A) CLEAR   Specific Gravity, Urine 1.009 1.005 - 1.030   pH 7.0 5.0 - 8.0   Glucose, UA NEGATIVE NEGATIVE mg/dL   Hgb urine dipstick NEGATIVE NEGATIVE   Bilirubin Urine NEGATIVE NEGATIVE   Ketones, ur NEGATIVE NEGATIVE mg/dL   Protein, ur NEGATIVE NEGATIVE mg/dL   Nitrite NEGATIVE NEGATIVE   Leukocytes,Ua NEGATIVE NEGATIVE  Fetal fibronectin   Collection Time: 08/24/22  2:32 PM  Result Value Ref Range   Fetal Fibronectin POSITIVE (A) NEGATIVE  Fern Test   Collection Time: 08/24/22  2:47 PM  Result Value Ref Range   POCT Fern Test Negative = intact amniotic membranes   CBC   Collection Time: 08/24/22  7:04 PM  Result Value Ref Range   WBC 13.5 (H) 4.0 - 10.5 K/uL   RBC 4.06 3.87 - 5.11 MIL/uL   Hemoglobin 11.7 (L) 12.0 - 15.0 g/dL   HCT 65.6 (L) 81.2 - 75.1 %   MCV 82.8 80.0 - 100.0 fL  MCH 28.8 26.0 - 34.0 pg   MCHC 34.8 30.0 - 36.0 g/dL   RDW 29.4 76.5 - 46.5 %   Platelets 228 150 - 400 K/uL   nRBC 0.0 0.0 - 0.2 %  RPR   Collection Time: 08/24/22  7:04 PM  Result Value Ref Range   RPR Ser Ql NON REACTIVE NON REACTIVE  Type and screen  [facility]   Collection Time: 08/24/22  7:04 PM  Result Value Ref Range   ABO/RH(D) A POS    Antibody Screen NEG    Sample Expiration      08/27/2022,2359 Performed at [facility], [address] [place], [place] [address]     Discharge Condition: Stable  Disposition: Discharge disposition: 01-Home or Self Care        Discharge Instructions     Discharge patient   Complete by: As directed    Discharge disposition: 01-Home or Self Care   Discharge patient date: 08/25/2022      Allergies as of 08/25/2022   No Known Allergies      Medication List     TAKE these medications    NIFEdipine 30 MG 24 hr tablet Commonly known as: ADALAT CC Take 1 tablet (30 mg total) by mouth every 12 (twelve) hours.   prenatal vitamin w/FE, FA 27-1 MG Tabs tablet Take 1 tablet by mouth daily at 12 noon.        Follow-up Information     [facility] Follow up in 1 week(s).   Specialty: Obstetrics and Gynecology Contact information: [address] [place] [address] [phone]                Signed: Scheryl Darter M.D. 08/25/2022, 3:29 PM

## 2022-08-25 NOTE — Progress Notes (Signed)
Discharge instructions and prescriptions given to pt. Discussed signs and symptoms to report to the MD, upcoming appointments, and meds. Pt verbalizes understanding and has no questions or concerns at this time. Pt discharged home from hospital in stable condition. 

## 2022-08-25 NOTE — Progress Notes (Signed)
Patient ID: Lynn Moses, female   DOB: Oct 24, 1996, 26 y.o.   MRN: [id number]  FACULTY PRACTICE ANTEPARTUM(COMPREHENSIVE) NOTE  Lynn Moses is a 26 y.o. G2P1001 at [redacted]w[redacted]d by early ultrasound who is admitted for Preterm labor.   Fetal presentation is cephalic. Length of Stay:  1  Days  Subjective: Rare contractions Patient reports the fetal movement as active. Patient reports uterine contraction  activity as rare. Patient reports  vaginal bleeding as none. Patient describes fluid per vagina as None.  Vitals:  Blood pressure 131/67, pulse 85, temperature 98.3 F (36.8 C), temperature source Oral, resp. rate 16, height 5\' 6"  (1.676 m), weight 80.3 kg, last menstrual period 01/26/2022, SpO2 100 %, not currently breastfeeding. Physical Examination:  General appearance - alert, well appearing, and in no distress Heart - normal rate and regular rhythm Abdomen - soft, nontender, nondistended Fundal Height:  size equals dates Cervical Exam: Not evaluated.. Extremities: extremities normal, atraumatic, no cyanosis or edema and Homans sign is negative, no sign of DVT with DTRs 2+ bilaterally Membranes:intact  Fetal Monitoring:   Fetal Heart Rate A   Mode External filed at 08/24/2022 2150  Baseline Rate (A) 150 bpm filed at 08/24/2022 2150  Variability 6-25 BPM filed at 08/24/2022 2150  Accelerations 10 x 10 filed at 08/24/2022 2150  Decelerations None filed at 08/24/2022 2150    Labs:  Results for orders placed or performed during the hospital encounter of 08/24/22 (from the past 24 hour(s))  Urinalysis, Routine w reflex microscopic Urine, Clean Catch   Collection Time: 08/24/22  1:06 PM  Result Value Ref Range   Color, Urine YELLOW YELLOW   APPearance CLOUDY (A) CLEAR   Specific Gravity, Urine 1.009 1.005 - 1.030   pH 7.0 5.0 - 8.0   Glucose, UA NEGATIVE NEGATIVE mg/dL   Hgb urine dipstick NEGATIVE NEGATIVE   Bilirubin Urine NEGATIVE NEGATIVE   Ketones, ur NEGATIVE NEGATIVE mg/dL    Protein, ur NEGATIVE NEGATIVE mg/dL   Nitrite NEGATIVE NEGATIVE   Leukocytes,Ua NEGATIVE NEGATIVE  Fetal fibronectin   Collection Time: 08/24/22  2:32 PM  Result Value Ref Range   Fetal Fibronectin POSITIVE (A) NEGATIVE  Fern Test   Collection Time: 08/24/22  2:47 PM  Result Value Ref Range   POCT Fern Test Negative = intact amniotic membranes   CBC   Collection Time: 08/24/22  7:04 PM  Result Value Ref Range   WBC 13.5 (H) 4.0 - 10.5 K/uL   RBC 4.06 3.87 - 5.11 MIL/uL   Hemoglobin 11.7 (L) 12.0 - 15.0 g/dL   HCT 08/26/22 (L) 37.6 - 28.3 %   MCV 82.8 80.0 - 100.0 fL   MCH 28.8 26.0 - 34.0 pg   MCHC 34.8 30.0 - 36.0 g/dL   RDW 15.1 76.1 - 60.7 %   Platelets 228 150 - 400 K/uL   nRBC 0.0 0.0 - 0.2 %  RPR   Collection Time: 08/24/22  7:04 PM  Result Value Ref Range   RPR Ser Ql NON REACTIVE NON REACTIVE  Type and screen [facility]   Collection Time: 08/24/22  7:04 PM  Result Value Ref Range   ABO/RH(D) A POS    Antibody Screen NEG    Sample Expiration      08/27/2022,2359 Performed at [facility], [address], [place], [place]      Medications:  Scheduled  betamethasone acetate-betamethasone sodium phosphate  12 mg Intramuscular Q24H   docusate sodium  100 mg  Oral Daily   NIFEdipine  30 mg Oral Q12H   prenatal multivitamin  1 tablet Oral Q1200   I have reviewed the patient's current medications.  ASSESSMENT: Patient Active Problem List   Diagnosis Date Noted   Preterm labor in second trimester 08/24/2022   History of anxiety 05/14/2022   Supervision of other normal pregnancy, antepartum 04/06/2022   Attention deficit disorder 03/10/2022   History of depression 06/12/2020    PLAN: Observe for preterm labor. Continue procardia and administer second BMZ dose today  Scheryl Darter 08/25/2022,10:21 AM

## 2022-08-26 ENCOUNTER — Telehealth (HOSPITAL_BASED_OUTPATIENT_CLINIC_OR_DEPARTMENT_OTHER): Payer: Self-pay | Admitting: Nurse Practitioner

## 2022-08-26 NOTE — Telephone Encounter (Signed)
Pt called regarding needing temp. Handcap placecard due to going into pre term labor. Pt was seen in ED for this. They sent this to pts OB but per [facility] manager they do not facilitate these and to be filled out by PCP. Pt would like this sent through MyChart when it completed if possible.  Pt due date is for Nov. 20th  Please advise.

## 2022-08-27 ENCOUNTER — Ambulatory Visit: Payer: No Typology Code available for payment source

## 2022-08-27 ENCOUNTER — Ambulatory Visit: Payer: PRIVATE HEALTH INSURANCE | Attending: Advanced Practice Midwife

## 2022-08-27 ENCOUNTER — Other Ambulatory Visit: Payer: Self-pay | Admitting: *Deleted

## 2022-08-27 DIAGNOSIS — Z3480 Encounter for supervision of other normal pregnancy, unspecified trimester: Secondary | ICD-10-CM

## 2022-08-27 DIAGNOSIS — O368130 Decreased fetal movements, third trimester, not applicable or unspecified: Secondary | ICD-10-CM | POA: Diagnosis present

## 2022-08-27 DIAGNOSIS — Z3A28 28 weeks gestation of pregnancy: Secondary | ICD-10-CM | POA: Insufficient documentation

## 2022-08-27 DIAGNOSIS — O2613 Low weight gain in pregnancy, third trimester: Secondary | ICD-10-CM | POA: Diagnosis not present

## 2022-08-27 DIAGNOSIS — Z362 Encounter for other antenatal screening follow-up: Secondary | ICD-10-CM

## 2022-08-31 NOTE — Telephone Encounter (Signed)
Follow up.

## 2022-09-01 ENCOUNTER — Encounter (HOSPITAL_BASED_OUTPATIENT_CLINIC_OR_DEPARTMENT_OTHER): Payer: Self-pay

## 2022-09-01 ENCOUNTER — Ambulatory Visit (INDEPENDENT_AMBULATORY_CARE_PROVIDER_SITE_OTHER): Payer: No Typology Code available for payment source | Admitting: Family Medicine

## 2022-09-01 VITALS — BP 112/66 | HR 89 | Wt 178.0 lb

## 2022-09-01 DIAGNOSIS — Z3A29 29 weeks gestation of pregnancy: Secondary | ICD-10-CM

## 2022-09-01 DIAGNOSIS — Z3480 Encounter for supervision of other normal pregnancy, unspecified trimester: Secondary | ICD-10-CM

## 2022-09-01 MED ORDER — NIFEDIPINE ER 30 MG PO TB24: 30.0000 mg | ORAL_TABLET | Freq: Two times a day (BID) | ORAL | 3 refills | Status: DC

## 2022-09-01 NOTE — Telephone Encounter (Signed)
Called and advised to pick up--or will be in [facility]

## 2022-09-01 NOTE — Progress Notes (Signed)
   PRENATAL VISIT NOTE  Subjective:  Lynn Moses is a 26 y.o. G2P1001 at [redacted]w[redacted]d being seen today for ongoing prenatal care.  She is currently monitored for the following issues for this high-risk pregnancy and has History of depression; Attention deficit disorder; Supervision of other normal pregnancy, antepartum; History of anxiety; and Preterm labor in third trimester on their problem list.  Patient reports no complaints. Was admitted for threatened PTL. Received BMZ. Cervix 1cm and unchanged. Discharged on procardia. Having occasional contractions, but overall okay. Contractions: Not present. Vag. Bleeding: None.  Movement: Present. Denies leaking of fluid.   The following portions of the patient's history were reviewed and updated as appropriate: allergies, current medications, past family history, past medical history, past social history, past surgical history and problem list.   Objective:   Vitals:   09/01/22 0815  BP: 112/66  Pulse: 89  Weight: 178 lb (80.7 kg)    Fetal Status: Fetal Heart Rate (bpm): 154   Movement: Present     General:  Alert, oriented and cooperative. Patient is in no acute distress.  Skin: Skin is warm and dry. No rash noted.   Cardiovascular: Normal heart rate noted  Respiratory: Normal respiratory effort, no problems with respiration noted  Abdomen: Soft, gravid, appropriate for gestational age.  Pain/Pressure: Present     Pelvic: Cervical exam deferred        Extremities: Normal range of motion.  Edema: None  Mental Status: Normal mood and affect. Normal behavior. Normal judgment and thought content.   Assessment and Plan:  Pregnancy: G2P1001 at [redacted]w[redacted]d 1. [redacted] weeks gestation of pregnancy  2. Supervision of other normal pregnancy, antepartum FHt and FH normal  3. Preterm labor in third trimester without delivery On procardia. Stable.  BMZ 8/29 & 8/30  Preterm labor symptoms and general obstetric precautions including but not limited to vaginal  bleeding, contractions, leaking of fluid and fetal movement were reviewed in detail with the patient. Please refer to After Visit Summary for other counseling recommendations.   No follow-ups on file.  Future Appointments  Date Time Provider Department Center  09/07/2022 11:15 AM Gerrit Heck, CNM [facility] None  09/21/2022 11:15 AM Aviva Signs, CNM [facility] None  09/22/2022 12:30 PM [facility] NURSE [facility]  09/22/2022 12:45 PM [facility] US6 [facility]  09/22/2022  2:15 PM [facility] NST [facility]  10/05/2022 11:15 AM Aviva Signs, CNM [facility] None  10/19/2022 11:15 AM Aviva Signs, CNM [facility] None  10/26/2022 11:15 AM Aviva Signs, CNM [facility] None  05/16/2023  8:10 AM Early, Sung Amabile, NP [facility]    Levie Heritage, DO

## 2022-09-03 ENCOUNTER — Encounter: Payer: Self-pay | Admitting: Family Medicine

## 2022-09-03 ENCOUNTER — Encounter: Payer: No Typology Code available for payment source | Admitting: Obstetrics and Gynecology

## 2022-09-07 ENCOUNTER — Ambulatory Visit (INDEPENDENT_AMBULATORY_CARE_PROVIDER_SITE_OTHER): Payer: No Typology Code available for payment source

## 2022-09-07 VITALS — BP 112/69 | HR 85 | Wt 181.0 lb

## 2022-09-07 DIAGNOSIS — Z23 Encounter for immunization: Secondary | ICD-10-CM

## 2022-09-07 DIAGNOSIS — Z3A30 30 weeks gestation of pregnancy: Secondary | ICD-10-CM

## 2022-09-07 DIAGNOSIS — Z8659 Personal history of other mental and behavioral disorders: Secondary | ICD-10-CM

## 2022-09-07 NOTE — Progress Notes (Signed)
   HIGH-RISK PREGNANCY OFFICE VISIT  Patient name: Lynn Moses MRN [id number]  Date of birth: 01-11-1996 Chief Complaint:   No chief complaint on file.  Subjective:   Lynn Moses is a 26 y.o. G55P1001 female at [redacted]w[redacted]d with an Estimated Date of Delivery: 11/15/22 being seen today for ongoing management of a High-risk pregnancy aeb has History of depression; Attention deficit disorder; Supervision of other normal pregnancy, antepartum; History of anxiety; and Preterm labor in third trimester on their problem list.  Patient presents today with no complaints.  Patient endorses fetal movement. Patient denies abdominal cramping, but is unsure if she is experiencing contractions.  She is currently taking procardia 30mg  BID for ctx and reports missing a dose yesterday. She states after missed dose she experienced stomach tightening until next dose.  Patient denies vaginal concerns including abnormal discharge, leaking of fluid, and bleeding.  Contractions: Not present. Vag. Bleeding: None.  Movement: Present.  Reviewed past medical,surgical, social, obstetrical and family history as well as problem list, medications and allergies.  Objective   Vitals:   09/07/22 1113  BP: 112/69  Pulse: 85  Weight: 181 lb (82.1 kg)  Body mass index is 29.21 kg/m.  Total Weight Gain:11 lb (4.99 kg)         Physical Examination:   General appearance: Well appearing, and in no distress  Mental status: Alert, oriented to person, place, and time  Skin: Warm & dry  Cardiovascular: Normal heart rate noted  Respiratory: Normal respiratory effort, no distress  Abdomen: Soft, gravid, nontender, AGA with    Pelvic: Cervical exam deferred           Extremities: Edema: None  Fetal Status: Fetal Heart Rate (bpm): 150  Movement: Present   No results found for this or any previous visit (from the past 24 hour(s)).  Assessment & Plan:  High-risk pregnancy of a 26 y.o., G2P1001 at [redacted]w[redacted]d with an Estimated Date of  Delivery: 11/15/22   1. [redacted] weeks gestation of pregnancy -Doing well. -Anticipatory guidance for upcoming appts. -Patient to schedule next appt in 2 weeks for an in-person visit.   2. History of anxiety -Reports intermittent depression/anxiety. Coping with distraction techniques. -Actively participating in therapy sessions.   3. Preterm labor in third trimester without delivery -Taking procardia 30mg  BID. -Encouraged to continue as symptoms presented with missing dosage.      Meds: No orders of the defined types were placed in this encounter.  Labs/procedures today:  Lab Orders  No laboratory test(s) ordered today     Reviewed: Preterm labor symptoms and general obstetric precautions including but not limited to vaginal bleeding, contractions, leaking of fluid and fetal movement were reviewed in detail with the patient.  All questions were answered.  Follow-up: No follow-ups on file.  Orders Placed This Encounter  Procedures   Flu Vaccine QUAD 36+ mos IM (Fluarix, Quad PF)   11/17/22 MSN, CNM 09/07/2022

## 2022-09-21 ENCOUNTER — Encounter: Payer: Self-pay | Admitting: Advanced Practice Midwife

## 2022-09-21 ENCOUNTER — Encounter: Payer: No Typology Code available for payment source | Admitting: Advanced Practice Midwife

## 2022-09-21 ENCOUNTER — Ambulatory Visit (INDEPENDENT_AMBULATORY_CARE_PROVIDER_SITE_OTHER): Payer: No Typology Code available for payment source | Admitting: Advanced Practice Midwife

## 2022-09-21 VITALS — BP 104/54 | HR 81 | Wt 187.0 lb

## 2022-09-21 DIAGNOSIS — Z3A32 32 weeks gestation of pregnancy: Secondary | ICD-10-CM

## 2022-09-21 NOTE — Progress Notes (Signed)
   PRENATAL VISIT NOTE  Subjective:  Lynn Moses is a 26 y.o. G2P1001 at [redacted]w[redacted]d being seen today for ongoing prenatal care.  She is currently monitored for the following issues for this low-risk pregnancy and has History of depression; Attention deficit disorder; Supervision of other normal pregnancy, antepartum; History of anxiety; and Preterm labor in third trimester on their problem list.  Patient reports no complaints.  Taking Procardia.  When she missed a dose, had mild contractions, so resumed  Contractions: Not present. Vag. Bleeding: None.  Movement: Present. Denies leaking of fluid.   The following portions of the patient's history were reviewed and updated as appropriate: allergies, current medications, past family history, past medical history, past social history, past surgical history and problem list.   Objective:   Vitals:   09/21/22 1113  BP: (!) 104/54  Pulse: 81  Weight: 187 lb (84.8 kg)    Fetal Status: Fetal Heart Rate (bpm): 143   Movement: Present     General:  Alert, oriented and cooperative. Patient is in no acute distress.  Skin: Skin is warm and dry. No rash noted.   Cardiovascular: Normal heart rate noted  Respiratory: Normal respiratory effort, no problems with respiration noted  Abdomen: Soft, gravid, appropriate for gestational age.  Pain/Pressure: Present     Pelvic: Cervical exam deferred        Extremities: Normal range of motion.  Edema: None  Mental Status: Normal mood and affect. Normal behavior. Normal judgment and thought content.   Assessment and Plan:  Pregnancy: G2P1001 at [redacted]w[redacted]d 1. [redacted] weeks gestation of pregnancy   2. Preterm labor in third trimester without delivery     Controlled with Procardia     Got Betamethasone  Preterm labor symptoms and general obstetric precautions including but not limited to vaginal bleeding, contractions, leaking of fluid and fetal movement were reviewed in detail with the patient. Please refer to After  Visit Summary for other counseling recommendations.   Return in about 2 weeks (around 10/05/2022) for [employer].  Future Appointments  Date Time Provider [place]  09/22/2022  1:00 PM [facility] NURSE [facility]  09/22/2022  1:15 PM [facility] NST [facility]  09/22/2022  2:30 PM [facility] US3 [facility]  10/01/2022  8:30 AM [facility] NURSE [facility]  10/01/2022  8:45 AM [facility] NST [facility]  10/05/2022 11:15 AM Seabron Spates, CNM [facility] None  10/19/2022 11:15 AM Seabron Spates, CNM [facility] None  10/26/2022 11:15 AM Seabron Spates, CNM [facility] None  11/02/2022 11:15 AM Seabron Spates, CNM [facility] None  11/09/2022  8:55 AM Seabron Spates, CNM [facility] None  11/16/2022 11:15 AM Seabron Spates, CNM [facility] None  05/16/2023  8:10 AM Early, Coralee Pesa, NP [facility]    Hansel Feinstein, CNM

## 2022-09-22 ENCOUNTER — Ambulatory Visit: Payer: Medicaid Other | Admitting: *Deleted

## 2022-09-22 ENCOUNTER — Ambulatory Visit: Payer: Medicaid Other | Attending: Maternal & Fetal Medicine | Admitting: *Deleted

## 2022-09-22 ENCOUNTER — Other Ambulatory Visit: Payer: No Typology Code available for payment source

## 2022-09-22 ENCOUNTER — Ambulatory Visit: Payer: No Typology Code available for payment source

## 2022-09-22 ENCOUNTER — Other Ambulatory Visit: Payer: Self-pay | Admitting: Maternal & Fetal Medicine

## 2022-09-22 ENCOUNTER — Other Ambulatory Visit: Payer: Self-pay | Admitting: *Deleted

## 2022-09-22 ENCOUNTER — Ambulatory Visit (HOSPITAL_BASED_OUTPATIENT_CLINIC_OR_DEPARTMENT_OTHER): Payer: Medicaid Other

## 2022-09-22 VITALS — BP 114/64 | HR 83

## 2022-09-22 DIAGNOSIS — Z3A32 32 weeks gestation of pregnancy: Secondary | ICD-10-CM | POA: Diagnosis not present

## 2022-09-22 DIAGNOSIS — Z362 Encounter for other antenatal screening follow-up: Secondary | ICD-10-CM

## 2022-09-22 DIAGNOSIS — O368130 Decreased fetal movements, third trimester, not applicable or unspecified: Secondary | ICD-10-CM | POA: Insufficient documentation

## 2022-09-22 DIAGNOSIS — O4700 False labor before 37 completed weeks of gestation, unspecified trimester: Secondary | ICD-10-CM

## 2022-09-22 DIAGNOSIS — O10913 Unspecified pre-existing hypertension complicating pregnancy, third trimester: Secondary | ICD-10-CM

## 2022-09-22 DIAGNOSIS — O4703 False labor before 37 completed weeks of gestation, third trimester: Secondary | ICD-10-CM

## 2022-09-22 DIAGNOSIS — Z3480 Encounter for supervision of other normal pregnancy, unspecified trimester: Secondary | ICD-10-CM

## 2022-09-22 NOTE — Procedures (Signed)
Ayelen Sciortino September 03, 1996 [redacted]w[redacted]d  Fetus A Non-Stress Test Interpretation for 09/22/22  Indication:  decreased fetal movement, threatened PTL  Fetal Heart Rate A Mode: External Baseline Rate (A): 145 bpm Variability: Moderate Accelerations: 15 x 15 Decelerations: None Multiple birth?: No  Uterine Activity Mode: Palpation, Toco Contraction Frequency (min): 2 ucs with ui Contraction Duration (sec): 50-70 Contraction Quality: Mild Resting Tone Palpated: Relaxed Resting Time: Adequate  Interpretation (Fetal Testing) Nonstress Test Interpretation: Reactive Overall Impression: Reassuring for gestational age Comments: Dr. Epimenio Sarin reviewed tracing.

## 2022-09-25 ENCOUNTER — Inpatient Hospital Stay (HOSPITAL_COMMUNITY)
Admission: AD | Admit: 2022-09-25 | Discharge: 2022-09-25 | Disposition: A | Discharge status: Home / Self Care | Payer: No Typology Code available for payment source | Attending: Obstetrics and Gynecology | Admitting: Obstetrics and Gynecology

## 2022-09-25 ENCOUNTER — Encounter (HOSPITAL_COMMUNITY): Payer: Self-pay | Admitting: Obstetrics and Gynecology

## 2022-09-25 DIAGNOSIS — O26893 Other specified pregnancy related conditions, third trimester: Secondary | ICD-10-CM | POA: Insufficient documentation

## 2022-09-25 DIAGNOSIS — F419 Anxiety disorder, unspecified: Secondary | ICD-10-CM | POA: Diagnosis not present

## 2022-09-25 DIAGNOSIS — U071 COVID-19: Secondary | ICD-10-CM | POA: Diagnosis not present

## 2022-09-25 DIAGNOSIS — O99343 Other mental disorders complicating pregnancy, third trimester: Secondary | ICD-10-CM | POA: Diagnosis not present

## 2022-09-25 DIAGNOSIS — Z3A32 32 weeks gestation of pregnancy: Secondary | ICD-10-CM | POA: Diagnosis not present

## 2022-09-25 DIAGNOSIS — Z3480 Encounter for supervision of other normal pregnancy, unspecified trimester: Secondary | ICD-10-CM

## 2022-09-25 DIAGNOSIS — O98513 Other viral diseases complicating pregnancy, third trimester: Secondary | ICD-10-CM

## 2022-09-25 LAB — COMPREHENSIVE METABOLIC PANEL
ALT: 27 U/L (ref 0–44)
AST: 33 U/L (ref 15–41)
Albumin: 2.6 g/dL — ABNORMAL LOW (ref 3.5–5.0)
Alkaline Phosphatase: 117 U/L (ref 38–126)
Anion gap: 10 (ref 5–15)
BUN: 6 mg/dL (ref 6–20)
CO2: 21 mmol/L — ABNORMAL LOW (ref 22–32)
Calcium: 8.7 mg/dL — ABNORMAL LOW (ref 8.9–10.3)
Chloride: 101 mmol/L (ref 98–111)
Creatinine, Ser: 0.74 mg/dL (ref 0.44–1.00)
GFR, Estimated: >60 mL/min (ref >60)
Glucose, Bld: 78 mg/dL (ref 70–99)
Potassium: 4.1 mmol/L (ref 3.5–5.1)
Sodium: 132 mmol/L — ABNORMAL LOW (ref 135–145)
Total Bilirubin: 0.5 mg/dL (ref 0.3–1.2)
Total Protein: 5.7 g/dL — ABNORMAL LOW (ref 6.5–8.1)

## 2022-09-25 LAB — CBC
HCT: 35 % — ABNORMAL LOW (ref 36.0–46.0)
Hemoglobin: 11.5 g/dL — ABNORMAL LOW (ref 12.0–15.0)
MCH: 28 pg (ref 26.0–34.0)
MCHC: 32.9 g/dL (ref 30.0–36.0)
MCV: 85.2 fL (ref 80.0–100.0)
Platelets: 179 10*3/uL (ref 150–400)
RBC: 4.11 MIL/uL (ref 3.87–5.11)
RDW: 13.6 % (ref 11.5–15.5)
WBC: 9.7 10*3/uL (ref 4.0–10.5)
nRBC: 0.2 % (ref 0.0–0.2)

## 2022-09-25 LAB — URINALYSIS, ROUTINE W REFLEX MICROSCOPIC
Bilirubin Urine: NEGATIVE
Glucose, UA: NEGATIVE mg/dL
Hgb urine dipstick: NEGATIVE
Ketones, ur: 80 mg/dL — AB
Leukocytes,Ua: NEGATIVE
Nitrite: NEGATIVE
Protein, ur: 30 mg/dL — AB
Specific Gravity, Urine: 1.021 (ref 1.005–1.030)
pH: 5 (ref 5.0–8.0)

## 2022-09-25 LAB — RESP PANEL BY RT-PCR (FLU A&B, COVID) ARPGX2
Influenza A by PCR: NEGATIVE
Influenza B by PCR: NEGATIVE
SARS Coronavirus 2 by RT PCR: POSITIVE — AB

## 2022-09-25 LAB — GROUP A STREP BY PCR: Group A Strep by PCR: NOT DETECTED

## 2022-09-25 MED ORDER — LACTATED RINGERS IV BOLUS
1000.0000 mL | Freq: Once | INTRAVENOUS | Status: AC
  Administered 2022-09-25: 1000 mL via INTRAVENOUS

## 2022-09-25 MED ORDER — NIRMATRELVIR/RITONAVIR (PAXLOVID)TABLET: ORAL_TABLET | ORAL | 0 refills | Status: DC

## 2022-09-25 MED ORDER — LACTATED RINGERS IV SOLN: INTRAVENOUS | Status: DC

## 2022-09-25 MED ORDER — ACETAMINOPHEN 325 MG PO TABS
650.0000 mg | ORAL_TABLET | Freq: Once | ORAL | Status: AC
  Administered 2022-09-25: 650 mg via ORAL
  Filled 2022-09-25: qty 2

## 2022-09-25 NOTE — MAU Provider Note (Addendum)
[name] is a 26 y.o.  26 y.o. G2P1001  At [redacted]w[redacted]d here with complaints of body aches, fever, nasal congestion, sore throat, HA. She denies VB, contractions, decreased fetal movements, LOF. She denies any complications in this pregnancy other than threatened preterm labor; had positive FFN a month ago but has not progressed to labor. She is getting weekly testing at [facility].   History     CSN: [id number]  Arrival date and time: 09/25/22 1053   None     Chief Complaint  Patient presents with   Fever   Headache   Headache  This is a new problem. The current episode started yesterday. The problem has been unchanged. The pain is at a severity of 8/10. Associated symptoms include coughing, a fever and a sore throat. Pertinent negatives include no ear pain, vomiting or weakness.  URI  This is a new problem. The current episode started yesterday. The problem has been unchanged. The maximum temperature recorded prior to her arrival was 102 - 102.9 F. Associated symptoms include congestion, coughing, headaches and a sore throat. Pertinent negatives include no ear pain or vomiting. She has tried nothing for the symptoms.    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    Review of Systems  Constitutional:  Positive for fatigue and fever.  HENT:  Positive for congestion and sore throat. Negative for ear pain.   Respiratory:  Positive for cough.        Occasionally  Cardiovascular: Negative.   Gastrointestinal: Negative.  Negative for vomiting.  Genitourinary: Negative.   Neurological:  Positive for headaches. Negative for weakness.  Hematological: Negative.    Physical Exam   Blood pressure (!) 95/55, pulse (!) 122, temperature 98.2 F (36.8 C), temperature source Oral, resp. rate 18, height 5\' 6"  (1.676 m), weight 84.9 kg, last menstrual period 01/26/2022, SpO2 99 %, not currently breastfeeding.  Physical Exam Constitutional:      Appearance: Normal appearance.  HENT:     Right Ear: Tympanic membrane normal.     Left Ear: Tympanic membrane normal.     Mouth/Throat:     Mouth: Mucous membranes are moist.  Pharynx: Uvula midline. Posterior oropharyngeal erythema present. No oropharyngeal exudate or uvula swelling.  Cardiovascular:     Rate and Rhythm: Normal rate and regular rhythm.     Pulses: Normal pulses.  Pulmonary:     Effort: Pulmonary effort is normal. No respiratory distress.     Breath sounds: Normal breath sounds. No stridor.  Abdominal:     Palpations: Abdomen is soft.  Neurological:     General: No focal deficit present.     Mental Status: She is alert.  Psychiatric:        Mood and Affect: Mood normal.     MAU Course  Procedures  MDM -patient CBC is normal, no white count -patient receiving IV LR and tylenol; she is resting in MAU -patient BP is noted to be low but she is lying on her side Patient Vitals for the past 24 hrs:  BP Temp Temp src Pulse Resp SpO2 Height Weight  09/25/22 1501 (!) 95/55 98.2 F (36.8 C) Oral (!) 122 18  99 % -- --  09/25/22 1358 -- -- -- -- -- 99 % -- --  09/25/22 1350 (!) 104/45 -- -- (!) 131 19 99 % -- --  09/25/22 1330 -- -- -- -- -- 97 % -- --  09/25/22 1245 (!) 107/50 -- -- (!) 141 -- -- -- --  09/25/22 1240 (!) 80/32 99.8 F (37.7 C) -- (!) 135 17 100 % -- --  09/25/22 1159 (!) 87/34 -- -- (!) 122 -- 99 % -- --  09/25/22 1139 (!) 85/38 -- -- (!) 130 -- -- -- --  09/25/22 1135 -- -- -- -- -- 96 % -- --  09/25/22 1124 (!) 82/39 -- -- (!) 141 -- -- -- --  09/25/22 1106 (!) 106/46 (!) 102.6 F (39.2 C) Oral (!) 152 15 100 % 5\' 6"  (1.676 m) 84.9 kg   NST: 150 bpm, mod var, present acel, no decels, occasional contractions. Baseline was initially 180 but with fluids and tylenol, maternal fever and pulse came down and FHR came down as well to 150.   Assessment and Plan   1. Supervision of other normal pregnancy, antepartum   2. COVID-19 affecting pregnancy in third trimester   3. [redacted] weeks gestation of pregnancy    -Patient HA is unresolved, however, she is stable for discharge -she will start Paxlovid treatment; confirmed with FM-OB that Paxlovid safe with procardia dosage  -discussed isolation precautions; patient lives in apartment with FOB and toddler; she cannot realistically isolate but will take precautions with family -patient will notify providers and school of her recent diagnosis -long discussion about reasons to return to MAU, namely, SOB, chest pain, feeling like she can't breathe.  Mervyn Skeeters Yariela Tison 09/26/2022, 7:54 AM

## 2022-09-25 NOTE — MAU Note (Signed)
...  Lynn Moses is a 26 y.o. at [redacted]w[redacted]d here in MAU reporting: HA that began last night around 2030 and body aches shortly after. She reports she did not take anything for the pain and went to sleep instead. She reports when she woke up this morning around 0600 her HA was still present as the body aches. She reports she also noted sore throat, nasal congestion, fever of 102.4 and a slight cough. Denies VB or LOF.  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   Onset of complaint: 2030 Last night.  Pain score:  8/10 HA - entire head 8/10 body aches  FHT: 176 initial external Lab orders placed from triage: UA

## 2022-09-28 ENCOUNTER — Telehealth: Payer: Self-pay

## 2022-09-28 MED ORDER — FAMOTIDINE 20 MG PO TABS: 20.0000 mg | ORAL_TABLET | Freq: Two times a day (BID) | ORAL | 3 refills | Status: DC

## 2022-09-28 NOTE — Telephone Encounter (Signed)
Pepcid sent to pharmacy  

## 2022-09-28 NOTE — Telephone Encounter (Signed)
Patient having terrible heartburn. States that when she is swallowing it is burning. Patient tried tums and has tried nexium with no relief.   Patient states that this started really bad over the weekend when she started her  Paaxlovid (covid positive Saturday Sept 30th).   Will route to provider for other solutions. Kathrene Alu RN

## 2022-09-28 NOTE — Addendum Note (Signed)
Addended by: Truett Mainland on: 09/28/2022 08:49 AM   Modules accepted: Orders

## 2022-10-01 ENCOUNTER — Ambulatory Visit: Payer: No Typology Code available for payment source | Attending: Obstetrics and Gynecology | Admitting: *Deleted

## 2022-10-01 ENCOUNTER — Ambulatory Visit: Payer: No Typology Code available for payment source | Admitting: *Deleted

## 2022-10-01 VITALS — BP 110/73 | HR 66

## 2022-10-01 DIAGNOSIS — Z3A33 33 weeks gestation of pregnancy: Secondary | ICD-10-CM | POA: Diagnosis not present

## 2022-10-01 DIAGNOSIS — O4703 False labor before 37 completed weeks of gestation, third trimester: Secondary | ICD-10-CM | POA: Insufficient documentation

## 2022-10-01 DIAGNOSIS — Z3480 Encounter for supervision of other normal pregnancy, unspecified trimester: Secondary | ICD-10-CM

## 2022-10-01 DIAGNOSIS — O10913 Unspecified pre-existing hypertension complicating pregnancy, third trimester: Secondary | ICD-10-CM

## 2022-10-01 NOTE — Procedures (Signed)
Donja Tipping May 03, 1996 [redacted]w[redacted]d  Fetus A Non-Stress Test Interpretation for 10/01/22  Indication: Chronic Hypertenstion  Fetal Heart Rate A Mode: External Baseline Rate (A): 135 bpm Variability: Moderate Accelerations: 15 x 15 Decelerations: None Multiple birth?: No  Uterine Activity Mode: Toco Contraction Frequency (min): occasional Contraction Duration (sec): 50-80 (not felt by pt) Contraction Quality: Mild Resting Tone Palpated: Relaxed  Interpretation (Fetal Testing) Nonstress Test Interpretation: Reactive Overall Impression: Reassuring for gestational age Comments: Tracing reviewed by Dr. Gertie Exon

## 2022-10-05 ENCOUNTER — Ambulatory Visit (INDEPENDENT_AMBULATORY_CARE_PROVIDER_SITE_OTHER): Payer: No Typology Code available for payment source | Admitting: Advanced Practice Midwife

## 2022-10-05 VITALS — BP 99/60 | HR 79 | Wt 185.0 lb

## 2022-10-05 DIAGNOSIS — Z3A34 34 weeks gestation of pregnancy: Secondary | ICD-10-CM

## 2022-10-05 DIAGNOSIS — Z3483 Encounter for supervision of other normal pregnancy, third trimester: Secondary | ICD-10-CM

## 2022-10-06 NOTE — Progress Notes (Signed)
Subjective:   Lynn Moses is a 26 y.o. G2P1001 [redacted]w[redacted]d being seen today for her obstetrical visit.  Patient reports no complaints.  Denies contractions, vaginal bleeding or leaking of fluid.  Reports good fetal movement.  The following portions of the patient's history were reviewed and updated as appropriate: allergies, current medications, past family history, past medical history, past social history, past surgical history and problem list.   Objective:  BP 99/60   Pulse 79   Wt 185 lb (83.9 kg)   LMP 01/26/2022 (Exact Date)   BMI 29.86 kg/m   FHT: Fetal Heart Rate (bpm): 155  Uterine Size: Fundal Height: 34 cm  Fetal Movement: Movement: Present  Presentation:      Abdomen:  soft, gravid, appropriate for gestational age,non-tender    Assessment and Plan:   Pregnancy:  G2P1001 at [redacted]w[redacted]d  1. [redacted] weeks gestation of pregnancy     Plan cultures next week     PTL precautions  Preterm labor symptoms: vaginal bleeding, contractions, and leaking of fluid reviewed in detail.  Fetal movement precautions reviewed.  Follow up in 2 weeks.  Seabron Spates, CNM

## 2022-10-08 ENCOUNTER — Ambulatory Visit: Payer: No Typology Code available for payment source | Attending: Maternal & Fetal Medicine

## 2022-10-08 ENCOUNTER — Ambulatory Visit: Payer: No Typology Code available for payment source | Admitting: *Deleted

## 2022-10-08 ENCOUNTER — Other Ambulatory Visit: Payer: Self-pay | Admitting: Maternal & Fetal Medicine

## 2022-10-08 VITALS — BP 109/64 | HR 79

## 2022-10-08 DIAGNOSIS — O368130 Decreased fetal movements, third trimester, not applicable or unspecified: Secondary | ICD-10-CM | POA: Diagnosis not present

## 2022-10-08 DIAGNOSIS — O10913 Unspecified pre-existing hypertension complicating pregnancy, third trimester: Secondary | ICD-10-CM

## 2022-10-08 DIAGNOSIS — Z3A34 34 weeks gestation of pregnancy: Secondary | ICD-10-CM | POA: Insufficient documentation

## 2022-10-08 DIAGNOSIS — O4703 False labor before 37 completed weeks of gestation, third trimester: Secondary | ICD-10-CM

## 2022-10-08 DIAGNOSIS — Z3480 Encounter for supervision of other normal pregnancy, unspecified trimester: Secondary | ICD-10-CM | POA: Diagnosis present

## 2022-10-15 ENCOUNTER — Ambulatory Visit: Payer: No Typology Code available for payment source | Admitting: *Deleted

## 2022-10-15 ENCOUNTER — Ambulatory Visit: Payer: No Typology Code available for payment source | Attending: Maternal & Fetal Medicine

## 2022-10-15 VITALS — BP 116/68 | HR 79

## 2022-10-15 DIAGNOSIS — Z3689 Encounter for other specified antenatal screening: Secondary | ICD-10-CM | POA: Diagnosis present

## 2022-10-15 DIAGNOSIS — O368130 Decreased fetal movements, third trimester, not applicable or unspecified: Secondary | ICD-10-CM | POA: Diagnosis not present

## 2022-10-15 DIAGNOSIS — Z3A36 36 weeks gestation of pregnancy: Secondary | ICD-10-CM | POA: Diagnosis not present

## 2022-10-15 DIAGNOSIS — O10913 Unspecified pre-existing hypertension complicating pregnancy, third trimester: Secondary | ICD-10-CM | POA: Diagnosis present

## 2022-10-15 DIAGNOSIS — O4703 False labor before 37 completed weeks of gestation, third trimester: Secondary | ICD-10-CM | POA: Diagnosis not present

## 2022-10-19 ENCOUNTER — Ambulatory Visit (INDEPENDENT_AMBULATORY_CARE_PROVIDER_SITE_OTHER): Payer: No Typology Code available for payment source | Admitting: Advanced Practice Midwife

## 2022-10-19 ENCOUNTER — Encounter: Payer: Self-pay | Admitting: Advanced Practice Midwife

## 2022-10-19 ENCOUNTER — Other Ambulatory Visit (HOSPITAL_COMMUNITY)
Admission: RE | Admit: 2022-10-19 | Discharge: 2022-10-19 | Disposition: A | Discharge status: Home / Self Care | Payer: No Typology Code available for payment source | Source: Ambulatory Visit | Attending: Advanced Practice Midwife | Admitting: Advanced Practice Midwife

## 2022-10-19 ENCOUNTER — Encounter: Payer: Self-pay | Admitting: General Practice

## 2022-10-19 VITALS — BP 113/73 | HR 77 | Wt 190.0 lb

## 2022-10-19 DIAGNOSIS — Z3A36 36 weeks gestation of pregnancy: Secondary | ICD-10-CM | POA: Diagnosis not present

## 2022-10-19 DIAGNOSIS — Z3480 Encounter for supervision of other normal pregnancy, unspecified trimester: Secondary | ICD-10-CM

## 2022-10-19 DIAGNOSIS — Z3483 Encounter for supervision of other normal pregnancy, third trimester: Secondary | ICD-10-CM | POA: Diagnosis not present

## 2022-10-19 DIAGNOSIS — Z8659 Personal history of other mental and behavioral disorders: Secondary | ICD-10-CM

## 2022-10-19 DIAGNOSIS — O2613 Low weight gain in pregnancy, third trimester: Secondary | ICD-10-CM

## 2022-10-19 NOTE — Progress Notes (Signed)
   PRENATAL VISIT NOTE  Subjective:  Lynn Moses is a 26 y.o. G2P1001 at [redacted]w[redacted]d being seen today for ongoing prenatal care.  She is currently monitored for the following issues for this low-risk pregnancy and has History of depression; Rubella non-immune status, antepartum; Attention deficit disorder; Supervision of other normal pregnancy, antepartum; History of anxiety; and Preterm labor in third trimester on their problem list.  Patient reports occasional contractions.  Contractions: Not present. Vag. Bleeding: None.  Movement: Present. Denies leaking of fluid.   The following portions of the patient's history were reviewed and updated as appropriate: allergies, current medications, past family history, past medical history, past social history, past surgical history and problem list.   Objective:   Vitals:   10/19/22 1103  BP: 113/73  Pulse: 77  Weight: 190 lb (86.2 kg)    Fetal Status: Fetal Heart Rate (bpm): 145   Movement: Present     General:  Alert, oriented and cooperative. Patient is in no acute distress.  Skin: Skin is warm and dry. No rash noted.   Cardiovascular: Normal heart rate noted  Respiratory: Normal respiratory effort, no problems with respiration noted  Abdomen: Soft, gravid, appropriate for gestational age.  Pain/Pressure: Absent     Pelvic: Cervical exam performed in the presence of a chaperone        Extremities: Normal range of motion.  Edema: None  Mental Status: Normal mood and affect. Normal behavior. Normal judgment and thought content.   Assessment and Plan:  Pregnancy: G2P1001 at [redacted]w[redacted]d 1. Supervision of other normal pregnancy, antepartum  - Culture, beta strep (group b only) - GC/Chlamydia probe amp ([place])not at [facility]  2. Poor weight gain of pregnancy, third trimester     Resolved,  TWG 30lbs   Fetal growth 15%  3. History of depression     stable - Ambulatory referral to [place]  4. History of anxiety     Stable - Ambulatory referral to [place]  Term labor symptoms and general obstetric precautions including but not limited to vaginal bleeding, contractions, leaking of fluid and fetal movement were reviewed in detail with the patient. Please refer to After Visit Summary for other counseling recommendations.   Return in about 1 week (around 10/26/2022) for [employer].  Future Appointments  Date Time Provider [place]  10/26/2022 11:15 AM Seabron Spates, CNM [facility] None  10/29/2022  9:15 AM [facility] NURSE [facility]  10/29/2022  9:30 AM [facility] US3 [facility]  11/02/2022 11:15 AM Seabron Spates, CNM [facility] None  11/05/2022 11:15 AM [facility] NURSE [facility]  11/05/2022 11:30 AM [facility] US2 [facility]  11/09/2022  8:55 AM Seabron Spates, CNM [facility] None  11/16/2022 11:15 AM Seabron Spates, CNM [facility] None  05/16/2023  8:10 AM Early, Coralee Pesa, NP [facility]    Hansel Feinstein, CNM

## 2022-10-20 LAB — GC/CHLAMYDIA PROBE AMP (~~LOC~~) NOT AT ARMC
Chlamydia: NEGATIVE
Comment: NEGATIVE
Comment: NORMAL
Neisseria Gonorrhea: NEGATIVE

## 2022-10-22 ENCOUNTER — Ambulatory Visit: Payer: No Typology Code available for payment source

## 2022-10-23 LAB — CULTURE, BETA STREP (GROUP B ONLY): Strep Gp B Culture: NEGATIVE

## 2022-10-26 ENCOUNTER — Ambulatory Visit (INDEPENDENT_AMBULATORY_CARE_PROVIDER_SITE_OTHER): Payer: No Typology Code available for payment source | Admitting: Advanced Practice Midwife

## 2022-10-26 VITALS — BP 115/76 | HR 79 | Wt 195.0 lb

## 2022-10-26 DIAGNOSIS — Z3480 Encounter for supervision of other normal pregnancy, unspecified trimester: Secondary | ICD-10-CM

## 2022-10-26 DIAGNOSIS — O2613 Low weight gain in pregnancy, third trimester: Secondary | ICD-10-CM

## 2022-10-26 DIAGNOSIS — Z3A37 37 weeks gestation of pregnancy: Secondary | ICD-10-CM

## 2022-10-26 NOTE — Progress Notes (Signed)
   PRENATAL VISIT NOTE  Subjective:  Lynn Moses is a 26 y.o. G2P1001 at [redacted]w[redacted]d being seen today for ongoing prenatal care.  She is currently monitored for the following issues for this low-risk pregnancy and has History of depression; Rubella non-immune status, antepartum; Attention deficit disorder; Supervision of other normal pregnancy, antepartum; History of anxiety; and Preterm labor in third trimester on their problem list.  Patient reports occasional contractions.  Contractions: Not present. Vag. Bleeding: None.  Movement: Present. Denies leaking of fluid.   The following portions of the patient's history were reviewed and updated as appropriate: allergies, current medications, past family history, past medical history, past social history, past surgical history and problem list.   Objective:   Vitals:   10/26/22 1112  BP: 115/76  Pulse: 79  Weight: 195 lb (88.5 kg)    Fetal Status:     Movement: Present     General:  Alert, oriented and cooperative. Patient is in no acute distress.  Skin: Skin is warm and dry. No rash noted.   Cardiovascular: Normal heart rate noted  Respiratory: Normal respiratory effort, no problems with respiration noted  Abdomen: Soft, gravid, appropriate for gestational age.  Pain/Pressure: Absent     Pelvic: Cervical exam performed in the presence of a chaperone        Cervix loose 2/60/-2/vertex   Extremities: Normal range of motion.  Edema: None  Mental Status: Normal mood and affect. Normal behavior. Normal judgment and thought content.   Assessment and Plan:  Pregnancy: G2P1001 at [redacted]w[redacted]d 1. Supervision of other normal pregnancy, antepartum      Reviewed signs of labor  2. Poor weight gain of pregnancy, third trimester     Last measured 15%ile.  Dr Epimenio Sarin does not recommend further USs  Term labor symptoms and general obstetric precautions including but not limited to vaginal bleeding, contractions, leaking of fluid and fetal movement were  reviewed in detail with the patient. Please refer to After Visit Summary for other counseling recommendations.     Future Appointments  Date Time Provider [place]  10/29/2022  9:15 AM [facility] NURSE [facility]  10/29/2022  9:30 AM [facility] US3 [facility]  11/02/2022 11:15 AM Luvenia Redden, PA-C [facility] None  11/05/2022 11:15 AM [facility] NURSE [facility]  11/05/2022 11:30 AM [facility] US2 [facility]  11/09/2022  8:55 AM Seabron Spates, CNM [facility] None  11/16/2022 11:15 AM Seabron Spates, CNM [facility] None  05/16/2023  8:10 AM Early, Coralee Pesa, NP [facility]    Hansel Feinstein, CNM

## 2022-10-29 ENCOUNTER — Ambulatory Visit: Payer: No Typology Code available for payment source

## 2022-11-02 ENCOUNTER — Encounter: Payer: Self-pay | Admitting: Medical

## 2022-11-02 ENCOUNTER — Ambulatory Visit (INDEPENDENT_AMBULATORY_CARE_PROVIDER_SITE_OTHER): Payer: No Typology Code available for payment source | Admitting: Medical

## 2022-11-02 VITALS — BP 117/70 | HR 98 | Wt 197.0 lb

## 2022-11-02 DIAGNOSIS — O09893 Supervision of other high risk pregnancies, third trimester: Secondary | ICD-10-CM

## 2022-11-02 DIAGNOSIS — Z3480 Encounter for supervision of other normal pregnancy, unspecified trimester: Secondary | ICD-10-CM

## 2022-11-02 DIAGNOSIS — Z3483 Encounter for supervision of other normal pregnancy, third trimester: Secondary | ICD-10-CM

## 2022-11-02 DIAGNOSIS — Z8659 Personal history of other mental and behavioral disorders: Secondary | ICD-10-CM

## 2022-11-02 DIAGNOSIS — Z2839 Other underimmunization status: Secondary | ICD-10-CM

## 2022-11-02 DIAGNOSIS — Z3A38 38 weeks gestation of pregnancy: Secondary | ICD-10-CM

## 2022-11-02 NOTE — Progress Notes (Signed)
   PRENATAL VISIT NOTE  Subjective:  Lynn Moses is a 26 y.o. G2P1001 at 19w1dbeing seen today for ongoing prenatal care.  She is currently monitored for the following issues for this low-risk pregnancy and has History of depression; Rubella non-immune status, antepartum; Attention deficit disorder; Supervision of other normal pregnancy, antepartum; History of anxiety; and Preterm labor in third trimester on their problem list.  Patient reports occasional contractions.  Contractions: Irritability. Vag. Bleeding: None.  Movement: Present. Denies leaking of fluid.   The following portions of the patient's history were reviewed and updated as appropriate: allergies, current medications, past family history, past medical history, past social history, past surgical history and problem list.   Objective:   Vitals:   11/02/22 1115  BP: 117/70  Pulse: 98  Weight: 197 lb (89.4 kg)    Fetal Status: Fetal Heart Rate (bpm): 150   Movement: Present     General:  Alert, oriented and cooperative. Patient is in no acute distress.  Skin: Skin is warm and dry. No rash noted.   Cardiovascular: Normal heart rate noted  Respiratory: Normal respiratory effort, no problems with respiration noted  Abdomen: Soft, gravid, appropriate for gestational age.  Pain/Pressure: Present     Pelvic: Cervical exam deferred        Extremities: Normal range of motion.  Edema: None  Mental Status: Normal mood and affect. Normal behavior. Normal judgment and thought content.   Assessment and Plan:  Pregnancy: G2P1001 at 364w1d. Supervision of other normal pregnancy, antepartum - Doing well   2. Rubella non-immune status, antepartum - PP MMR   3. History of depression - Has therapist, no meds   4. History of anxiety - Has therapist, no meds   5. [redacted] weeks gestation of pregnancy  Term labor symptoms and general obstetric precautions including but not limited to vaginal bleeding, contractions, leaking of fluid  and fetal movement were reviewed in detail with the patient. Please refer to After Visit Summary for other counseling recommendations.   Return in about 1 week (around 11/09/2022) for LOB, In-Person, any provider.  Future Appointments  Date Time Provider [place]11/14/2023  8:55 AM WiSeabron SpatesCNM [facility] None  11/16/2022 11:15 AM WiSeabron SpatesCNM [facility] None  05/16/2023  8:10 AM Early, SaCoralee PesaNP [facility]    JuKerry HoughPA-C

## 2022-11-05 ENCOUNTER — Other Ambulatory Visit: Payer: No Typology Code available for payment source

## 2022-11-05 ENCOUNTER — Ambulatory Visit: Payer: No Typology Code available for payment source

## 2022-11-09 ENCOUNTER — Inpatient Hospital Stay (HOSPITAL_COMMUNITY): Payer: No Typology Code available for payment source | Admitting: Anesthesiology

## 2022-11-09 ENCOUNTER — Encounter (HOSPITAL_COMMUNITY): Payer: Self-pay | Admitting: Obstetrics and Gynecology

## 2022-11-09 ENCOUNTER — Encounter: Payer: No Typology Code available for payment source | Admitting: Advanced Practice Midwife

## 2022-11-09 ENCOUNTER — Inpatient Hospital Stay (HOSPITAL_COMMUNITY)
Admission: AD | Admit: 2022-11-09 | Discharge: 2022-11-10 | DRG: 807 | Disposition: A | Discharge status: Home / Self Care | Payer: No Typology Code available for payment source | Attending: Obstetrics and Gynecology | Admitting: Obstetrics and Gynecology

## 2022-11-09 ENCOUNTER — Other Ambulatory Visit: Payer: Self-pay

## 2022-11-09 DIAGNOSIS — Z87891 Personal history of nicotine dependence: Secondary | ICD-10-CM

## 2022-11-09 DIAGNOSIS — Z2839 Other underimmunization status: Secondary | ICD-10-CM

## 2022-11-09 DIAGNOSIS — O26893 Other specified pregnancy related conditions, third trimester: Secondary | ICD-10-CM | POA: Diagnosis not present

## 2022-11-09 DIAGNOSIS — O4202 Full-term premature rupture of membranes, onset of labor within 24 hours of rupture: Secondary | ICD-10-CM | POA: Diagnosis not present

## 2022-11-09 DIAGNOSIS — Z3480 Encounter for supervision of other normal pregnancy, unspecified trimester: Secondary | ICD-10-CM

## 2022-11-09 DIAGNOSIS — Z8616 Personal history of COVID-19: Secondary | ICD-10-CM | POA: Diagnosis not present

## 2022-11-09 DIAGNOSIS — Z3A39 39 weeks gestation of pregnancy: Secondary | ICD-10-CM

## 2022-11-09 DIAGNOSIS — Z8659 Personal history of other mental and behavioral disorders: Secondary | ICD-10-CM

## 2022-11-09 DIAGNOSIS — O09899 Supervision of other high risk pregnancies, unspecified trimester: Secondary | ICD-10-CM

## 2022-11-09 LAB — CBC
HCT: 38.9 % (ref 36.0–46.0)
Hemoglobin: 13.1 g/dL (ref 12.0–15.0)
MCH: 27.8 pg (ref 26.0–34.0)
MCHC: 33.7 g/dL (ref 30.0–36.0)
MCV: 82.6 fL (ref 80.0–100.0)
Platelets: 268 10*3/uL (ref 150–400)
RBC: 4.71 MIL/uL (ref 3.87–5.11)
RDW: 14.4 % (ref 11.5–15.5)
WBC: 11.4 10*3/uL — ABNORMAL HIGH (ref 4.0–10.5)
nRBC: 0 % (ref 0.0–0.2)

## 2022-11-09 LAB — TYPE AND SCREEN
ABO/RH(D): A POS
Antibody Screen: NEGATIVE

## 2022-11-09 LAB — RPR: RPR Ser Ql: NONREACTIVE

## 2022-11-09 MED ORDER — WITCH HAZEL-GLYCERIN EX PADS: 1.0000 | MEDICATED_PAD | CUTANEOUS | Status: DC | PRN

## 2022-11-09 MED ORDER — ACETAMINOPHEN 325 MG PO TABS: 650.0000 mg | ORAL_TABLET | ORAL | Status: DC | PRN

## 2022-11-09 MED ORDER — ZOLPIDEM TARTRATE 5 MG PO TABS: 5.0000 mg | ORAL_TABLET | Freq: Every evening | ORAL | Status: DC | PRN

## 2022-11-09 MED ORDER — SODIUM CHLORIDE 0.9 % IV SOLN: 250.0000 mL | INTRAVENOUS | Status: DC | PRN

## 2022-11-09 MED ORDER — FLEET ENEMA 7-19 GM/118ML RE ENEM: 1.0000 | ENEMA | RECTAL | Status: DC | PRN

## 2022-11-09 MED ORDER — SIMETHICONE 80 MG PO CHEW: 80.0000 mg | CHEWABLE_TABLET | ORAL | Status: DC | PRN

## 2022-11-09 MED ORDER — SOD CITRATE-CITRIC ACID 500-334 MG/5ML PO SOLN: 30.0000 mL | ORAL | Status: DC | PRN

## 2022-11-09 MED ORDER — IBUPROFEN 600 MG PO TABS
600.0000 mg | ORAL_TABLET | Freq: Four times a day (QID) | ORAL | Status: DC
  Administered 2022-11-09 – 2022-11-10 (×4): 600 mg via ORAL
  Filled 2022-11-09 (×4): qty 1

## 2022-11-09 MED ORDER — FENTANYL-BUPIVACAINE-NACL 0.5-0.125-0.9 MG/250ML-% EP SOLN
12.0000 mL/h | EPIDURAL | Status: DC | PRN
  Administered 2022-11-09: 12 mL/h via EPIDURAL
  Filled 2022-11-09: qty 250

## 2022-11-09 MED ORDER — LIDOCAINE HCL (PF) 1 % IJ SOLN: 30.0000 mL | INTRAMUSCULAR | Status: DC | PRN

## 2022-11-09 MED ORDER — LACTATED RINGERS IV SOLN: INTRAVENOUS | Status: DC

## 2022-11-09 MED ORDER — DIPHENHYDRAMINE HCL 25 MG PO CAPS: 25.0000 mg | ORAL_CAPSULE | Freq: Four times a day (QID) | ORAL | Status: DC | PRN

## 2022-11-09 MED ORDER — FENTANYL CITRATE (PF) 100 MCG/2ML IJ SOLN
100.0000 ug | INTRAMUSCULAR | Status: DC | PRN
  Administered 2022-11-09: 100 ug via INTRAVENOUS
  Filled 2022-11-09: qty 2

## 2022-11-09 MED ORDER — SODIUM CHLORIDE 0.9% FLUSH: 3.0000 mL | INTRAVENOUS | Status: DC | PRN

## 2022-11-09 MED ORDER — ONDANSETRON HCL 4 MG/2ML IJ SOLN: 4.0000 mg | Freq: Four times a day (QID) | INTRAMUSCULAR | Status: DC | PRN

## 2022-11-09 MED ORDER — SODIUM CHLORIDE 0.9% FLUSH: 3.0000 mL | Freq: Two times a day (BID) | INTRAVENOUS | Status: DC

## 2022-11-09 MED ORDER — LACTATED RINGERS IV SOLN: 500.0000 mL | INTRAVENOUS | Status: DC | PRN

## 2022-11-09 MED ORDER — EPHEDRINE 5 MG/ML INJ: 10.0000 mg | INTRAVENOUS | Status: DC | PRN

## 2022-11-09 MED ORDER — BENZOCAINE-MENTHOL 20-0.5 % EX AERO: 1.0000 | INHALATION_SPRAY | CUTANEOUS | Status: DC | PRN

## 2022-11-09 MED ORDER — TETANUS-DIPHTH-ACELL PERTUSSIS 5-2.5-18.5 LF-MCG/0.5 IM SUSY: 0.5000 mL | PREFILLED_SYRINGE | Freq: Once | INTRAMUSCULAR | Status: DC

## 2022-11-09 MED ORDER — MEDROXYPROGESTERONE ACETATE 150 MG/ML IM SUSP: 150.0000 mg | INTRAMUSCULAR | Status: DC | PRN

## 2022-11-09 MED ORDER — OXYTOCIN BOLUS FROM INFUSION
333.0000 mL | Freq: Once | INTRAVENOUS | Status: DC
  Administered 2022-11-09: 333 mL via INTRAVENOUS

## 2022-11-09 MED ORDER — LIDOCAINE HCL (PF) 1 % IJ SOLN
INTRAMUSCULAR | Status: DC | PRN
  Administered 2022-11-09: 4 mL via EPIDURAL
  Administered 2022-11-09: 6 mL via EPIDURAL

## 2022-11-09 MED ORDER — OXYCODONE-ACETAMINOPHEN 5-325 MG PO TABS: 1.0000 | ORAL_TABLET | ORAL | Status: DC | PRN

## 2022-11-09 MED ORDER — PHENYLEPHRINE 80 MCG/ML (10ML) SYRINGE FOR IV PUSH (FOR BLOOD PRESSURE SUPPORT): 80.0000 ug | PREFILLED_SYRINGE | INTRAVENOUS | Status: DC | PRN

## 2022-11-09 MED ORDER — COCONUT OIL OIL: 1.0000 | TOPICAL_OIL | Status: DC | PRN

## 2022-11-09 MED ORDER — DIBUCAINE (PERIANAL) 1 % EX OINT: 1.0000 | TOPICAL_OINTMENT | CUTANEOUS | Status: DC | PRN

## 2022-11-09 MED ORDER — SENNOSIDES-DOCUSATE SODIUM 8.6-50 MG PO TABS
2.0000 | ORAL_TABLET | ORAL | Status: DC
  Administered 2022-11-10: 2 via ORAL
  Filled 2022-11-09: qty 2

## 2022-11-09 MED ORDER — PRENATAL MULTIVITAMIN CH
1.0000 | ORAL_TABLET | Freq: Every day | ORAL | Status: DC
  Administered 2022-11-10: 1 via ORAL
  Filled 2022-11-09: qty 1

## 2022-11-09 MED ORDER — ONDANSETRON HCL 4 MG/2ML IJ SOLN: 4.0000 mg | INTRAMUSCULAR | Status: DC | PRN

## 2022-11-09 MED ORDER — OXYTOCIN-SODIUM CHLORIDE 30-0.9 UT/500ML-% IV SOLN
2.5000 [IU]/h | INTRAVENOUS | Status: DC
  Filled 2022-11-09: qty 500

## 2022-11-09 MED ORDER — ONDANSETRON HCL 4 MG PO TABS: 4.0000 mg | ORAL_TABLET | ORAL | Status: DC | PRN

## 2022-11-09 MED ORDER — OXYCODONE-ACETAMINOPHEN 5-325 MG PO TABS: 2.0000 | ORAL_TABLET | ORAL | Status: DC | PRN

## 2022-11-09 MED ORDER — DIPHENHYDRAMINE HCL 50 MG/ML IJ SOLN: 12.5000 mg | INTRAMUSCULAR | Status: DC | PRN

## 2022-11-09 MED ORDER — LACTATED RINGERS IV SOLN: 500.0000 mL | Freq: Once | INTRAVENOUS | Status: DC

## 2022-11-09 MED ORDER — FENTANYL CITRATE (PF) 100 MCG/2ML IJ SOLN
INTRAMUSCULAR | Status: AC
  Administered 2022-11-09: 100 ug via INTRAVENOUS
  Filled 2022-11-09: qty 2

## 2022-11-09 NOTE — Discharge Summary (Addendum)
   Postpartum Discharge Summary     Patient Name: Lynn Moses DOB: 09/13/1996 MRN: [id number]  Date of admission: 11/09/2022 Delivery date:11/09/2022  Delivering provider: STINSON, JACOB J  Date of discharge: 11/10/2022  Admitting diagnosis: Normal labor and delivery [O80] Intrauterine pregnancy: [redacted]w[redacted]d     Secondary diagnosis:  Principal Problem:   Normal labor and delivery Active Problems:   History of depression   Rubella non-immune status, antepartum   Supervision of other normal pregnancy, antepartum   Vaginal delivery  Additional problems: none    Discharge diagnosis: Term Pregnancy Delivered                                              Post partum procedures: none Augmentation: N/A Complications: None  Hospital course: Onset of Labor With Vaginal Delivery      26 y.o. yo G2P1001 at [redacted]w[redacted]d was admitted in Active Labor on 11/09/2022. Labor course was uncomplicated. Membrane Rupture Time/Date: 11:25 AM ,11/09/2022   Delivery Method:Vaginal, Spontaneous  Episiotomy: None  Lacerations:  1st degree;Perineal  Patient had a postpartum course that was uncomplicated.  She is ambulating, tolerating a regular diet, passing flatus, and urinating well. Patient is discharged home in stable condition on 11/10/22.  Newborn Data: Birth date:11/09/2022  Birth time:2:48 PM  Gender:Female  Living status:Living  Apgars:7 ,9  Weight:3120 g   Magnesium Sulfate received: No BMZ received: No Rhophylac:N/A MMR:N/A T-DaP: Given prenatally Flu: N/A Given prenatally Transfusion:No  Physical exam  Vitals:   11/09/22 1730 11/09/22 2130 11/10/22 0130 11/10/22 0545  BP: 119/77 100/60 121/70 107/75  Pulse: 88 80 75 83  Resp: 19 18 18 16  Temp: 98.1 F (36.7 C) 98.4 F (36.9 C) 98.3 F (36.8 C) 98.4 F (36.9 C)  TempSrc: Oral  Oral Oral  SpO2: 100% 99% 99% 100%  Weight:      Height:       General: alert, cooperative, and no distress Lochia: appropriate Uterine Fundus:  firm Incision: N/A DVT Evaluation: No evidence of DVT seen on physical exam. No significant calf/ankle edema. Labs: Lab Results  Component Value Date   WBC 11.4 (H) 11/09/2022   HGB 13.1 11/09/2022   HCT 38.9 11/09/2022   MCV 82.6 11/09/2022   PLT 268 11/09/2022      Latest Ref Rng & Units 09/25/2022   11:55 AM  CMP  Glucose 70 - 99 mg/dL 78   BUN 6 - 20 mg/dL 6   Creatinine 0.44 - 1.00 mg/dL 0.74   Sodium 135 - 145 mmol/L 132   Potassium 3.5 - 5.1 mmol/L 4.1   Chloride 98 - 111 mmol/L 101   CO2 22 - 32 mmol/L 21   Calcium 8.9 - 10.3 mg/dL 8.7   Total Protein 6.5 - 8.1 g/dL 5.7   Total Bilirubin 0.3 - 1.2 mg/dL 0.5   Alkaline Phos 38 - 126 U/L 117   AST 15 - 41 U/L 33   ALT 0 - 44 U/L 27    Edinburgh Score:    11/09/2022    4:20 PM  Edinburgh Postnatal Depression Scale Screening Tool  I have been able to laugh and see the funny side of things. 1  I have looked forward with enjoyment to things. 1  I have blamed myself unnecessarily when things went wrong. 2  I have been anxious or worried for no good   reason. 3  I have felt scared or panicky for no good reason. 2  Things have been getting on top of me. 2  I have been so unhappy that I have had difficulty sleeping. 2  I have felt sad or miserable. 1  I have been so unhappy that I have been crying. 1  The thought of harming myself has occurred to me. 0  Edinburgh Postnatal Depression Scale Total 15     After visit meds:  Allergies as of 11/10/2022   No Known Allergies      Medication List     TAKE these medications    acetaminophen 500 MG tablet Commonly known as: TYLENOL Take 2 tablets (1,000 mg total) by mouth every 6 (six) hours as needed for moderate pain, headache or mild pain.   ibuprofen 600 MG tablet Commonly known as: ADVIL Take 1 tablet (600 mg total) by mouth every 6 (six) hours.   prenatal vitamin w/FE, FA 27-1 MG Tabs tablet Take 1 tablet by mouth daily at 12 noon.         Discharge  home in stable condition Infant Feeding: Bottle, Breast (Wishes to breast feed before she goes back to school, will transition to bottle during school).  Infant Disposition:home with mother Discharge instruction: per After Visit Summary and Postpartum booklet. Activity: Advance as tolerated. Pelvic rest for 6 weeks.  Diet: routine diet Future Appointments: Future Appointments  Date Time Provider [place]  12/14/2022 11:15 AM Seabron Spates, CNM [facility] None  05/16/2023  8:10 AM Early, Coralee Pesa, NP [facility]   Follow up Visit:  Message sent   Please schedule this patient for a In person postpartum visit in 4 weeks with the following provider: Any provider. Additional Postpartum F/U: None   Low risk pregnancy complicated by:  NA Delivery mode:  Vaginal, Spontaneous  Anticipated Birth Control:  Depo outpatient   11/10/2022 Wilhemina Cash, MD  GME ATTESTATION:  I saw and evaluated the patient. I agree with the findings and the plan of care as documented in the resident's note. I have made changes to documentation as necessary.  Gerlene Fee, DO OB Fellow, [place] [facility] [place] 11/10/2022, 8:04 AM

## 2022-11-09 NOTE — Anesthesia Preprocedure Evaluation (Signed)
Anesthesia Evaluation  Patient identified by MRN, date of birth, ID band Patient awake    Reviewed: Allergy & Precautions, H&P , NPO status , Patient's Chart, lab work & pertinent test results  History of Anesthesia Complications Negative for: history of anesthetic complications  Airway Mallampati: II  TM Distance: >3 FB     Dental   Pulmonary neg pulmonary ROS, former smoker   Pulmonary exam normal        Cardiovascular negative cardio ROS  Rhythm:regular Rate:Normal     Neuro/Psych negative neurological ROS  negative psych ROS   GI/Hepatic negative GI ROS, Neg liver ROS,,,  Endo/Other  negative endocrine ROS    Renal/GU negative Renal ROS  negative genitourinary   Musculoskeletal   Abdominal   Peds  Hematology negative hematology ROS (+)   Anesthesia Other Findings   Reproductive/Obstetrics (+) Pregnancy                              Anesthesia Physical Anesthesia Plan  ASA: 2  Anesthesia Plan: Epidural   Post-op Pain Management:    Induction:   PONV Risk Score and Plan:   Airway Management Planned:   Additional Equipment:   Intra-op Plan:   Post-operative Plan:   Informed Consent: I have reviewed the patients History and Physical, chart, labs and discussed the procedure including the risks, benefits and alternatives for the proposed anesthesia with the patient or authorized representative who has indicated his/her understanding and acceptance.       Plan Discussed with:   Anesthesia Plan Comments:          Anesthesia Quick Evaluation  

## 2022-11-09 NOTE — Anesthesia Procedure Notes (Signed)
Epidural Patient location during procedure: OB Start time: 11/09/2022 11:08 AM End time: 11/09/2022 11:16 AM  Staffing Anesthesiologist: Lucretia Kern, MD Performed: anesthesiologist   Preanesthetic Checklist Completed: patient identified, IV checked, risks and benefits discussed, monitors and equipment checked, pre-op evaluation and timeout performed  Epidural Patient position: sitting Prep: DuraPrep Patient monitoring: heart rate, continuous pulse ox and blood pressure Approach: midline Location: L3-L4 Injection technique: LOR air  Needle:  Needle type: Tuohy  Needle gauge: 17 G Needle length: 9 cm Needle insertion depth: 7 cm Catheter type: closed end flexible Catheter size: 19 Gauge Catheter at skin depth: 12 cm Test dose: negative  Assessment Events: blood not aspirated, injection not painful, no injection resistance, no paresthesia and negative IV test  Additional Notes Reason for block:procedure for pain

## 2022-11-09 NOTE — Lactation Note (Signed)
This note was copied from a baby's chart. Lactation Consultation Note  Patient Name: Lynn Moses [id number] Date: 11/09/2022 Reason for consult: Initial assessment Age:25 hours, P2, term female infant. See Birth Parent maternal hx below regarding BF expereince. Per Birth Parent, BF is going well she doesn't have any questions or concerns for LC at this time. Infant latched well in L&D for 30 minutes and 10 minutes on MBU, infant asleep in basinet at this time. Birth Parent will continue to BF infant according to hunger cues, on demand, 8 to 12+ times within 24 hours, STS. Birth Parent knows she can ask RN/LC for latch assistance if needed. Mom made aware of O/P services, breastfeeding support groups, community resources, and our phone # for post-discharge questions.   .  Maternal Data Has patient been taught Hand Expression?: Yes Does the patient have breastfeeding experience prior to this delivery?: Yes How long did the patient breastfeed?: Per Birth Parent, she BF 1st child who is 23 months for 8 months.  Feeding Mother's Current Feeding Choice: Breast Milk and Formula  LATCH Score                    Lactation Tools Discussed/Used    Interventions Interventions: Breast feeding basics reviewed;Position options;Skin to skin;Hand express;LC Services brochure  Discharge Pump: DEBP;Personal (Per Birth Parent, she has Dr. Theora Gianotti DEBP at home.)  Consult Status Consult Status: Follow-up Date: 11/10/22 Follow-up type: In-patient    Frederico Hamman 11/09/2022, 6:56 PM

## 2022-11-09 NOTE — MAU Note (Signed)
Lynn Moses is a 26 y.o. at [redacted]w[redacted]d here in MAU reporting: contractions started around 0350, they are now every 5-10 minutes. Seeing some pink tinged mucus. No LOF. Unsure about FM.  Onset of complaint: today  Pain score: 7/10  Vitals:   11/09/22 0725  BP: 124/77  Pulse: 77  Resp: 20  Temp: 98 F (36.7 C)  SpO2: 98%     FHT:150  Lab orders placed from triage: none

## 2022-11-09 NOTE — Progress Notes (Signed)
LABOR PROGRESS NOTE  Lynn Moses is a 26 y.o. G2P1001 at [redacted]w[redacted]d  admitted for spontaneous onset of labor  Subjective: Comfortable after epidural  Objective: BP 115/83   Pulse 72   Temp 98 F (36.7 C) (Oral)   Resp 16   Ht 5\' 6"  (1.676 m)   Wt 89 kg   LMP 01/26/2022 (Exact Date)   SpO2 98%   BMI 31.68 kg/m  or  Vitals:   11/09/22 1201 11/09/22 1231 11/09/22 1301 11/09/22 1330  BP: 110/70 112/71 107/65 115/83  Pulse: 75 77 78 72  Resp:   18 16  Temp:      TempSrc:      SpO2:      Weight:      Height:         Dilation: 9 Effacement (%): 90 Cervical Position: Posterior Station: 0 Presentation: Vertex Exam by:: [email or web address], RNC FHT: baseline rate 150, moderate varibility, 15x15 accel, variable decel Toco: every 2-3 min  Labs: Lab Results  Component Value Date   WBC 11.4 (H) 11/09/2022   HGB 13.1 11/09/2022   HCT 38.9 11/09/2022   MCV 82.6 11/09/2022   PLT 268 11/09/2022    Patient Active Problem List   Diagnosis Date Noted   Normal labor and delivery 11/09/2022   Preterm labor in third trimester 08/24/2022   History of anxiety 05/14/2022   Supervision of other normal pregnancy, antepartum 04/06/2022   Attention deficit disorder 03/10/2022   Rubella non-immune status, antepartum 12/18/2020   History of depression 06/12/2020    Assessment / Plan: 26 y.o. G2P1001 at [redacted]w[redacted]d here for SOL  Labor: Progressing well. SROM @1125  with particulate in fluid. Bulging forebag.  Fetal Wellbeing:  Baby had decel while attempting to break forebag. Repositioned mom to hands and knees w/resolution. CTM Pain Control:  Epidural Anticipated MOD:  Vaginal  [redacted]w[redacted]d, MD PGY-1 Family Medicine Resident [facility] 11/09/2022, 1:41 PM

## 2022-11-09 NOTE — H&P (Cosign Needed Addendum)
OBSTETRIC ADMISSION HISTORY AND PHYSICAL  Lynn Moses is a 26 y.o. female G2P1001 with IUP at [redacted]w[redacted]d by [redacted]w[redacted]d presenting for SOL. She reports +FMs, No LOF, no VB, no blurry vision, headaches or peripheral edema, and RUQ pain.  She plans on breast and bottle feeding. She request depo for birth control. She received her prenatal care at 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   Ovarian cyst 01/2020    Past Surgical History: Past Surgical History:  Procedure Laterality Date   NO PAST SURGERIES      Obstetrical History: OB History     Gravida  2   Para  1   Term  1   Preterm      AB      Living  1      SAB      IAB      Ectopic      Multiple  0   Live Births  1           Social History Social History   Socioeconomic History   Marital status: 02/2020    Spouse name: Not on file   Number of children: 1   Years of education: 13   Highest education level: Some college, no degree  Occupational History   Not on file  Tobacco Use   Smoking status: Former    Types: Cigarettes   Smokeless tobacco: Never  Vaping Use   Vaping Use: Never used  Substance and Sexual Activity   Alcohol use: Not Currently   Drug use: Never   Sexual activity: Yes    Birth control/protection: None  Other Topics Concern   Not on file  Social History Narrative   Not on file   Social Determinants of Health   Financial Resource Strain: Not on file  Food  Insecurity: Not on file  Transportation Needs: Not on file  Physical Activity: Not on file  Stress: Not on file  Social Connections: Not on file    Family History: Family History  Problem Relation Age of Onset   Varicose Veins Mother    Heart disease Maternal Grandmother    Hypertension Maternal Grandmother    Cancer Paternal Grandmother     Allergies: No Known Allergies  Medications Prior to Admission  Medication Sig Dispense Refill Last Dose   prenatal vitamin w/FE, FA (PRENATAL 1 + 1) 27-1 MG TABS tablet Take 1 tablet by mouth daily at 12 noon.        Review of Systems   All systems reviewed and negative except as stated in HPI  Blood pressure 137/89, pulse 92, temperature 98 F (36.7 C), temperature source Oral, resp. rate 20, height 5\' 6"  (1.676 m), weight 89 kg, last menstrual period 01/26/2022, SpO2 98 %, not currently breastfeeding. General appearance: Uncomfortable Lungs: clear to auscultation bilaterally Heart: regular rate and rhythm Abdomen: soft, non-tender; bowel sounds normal Extremities: Homans sign is negative, no  sign of DVT Presentation: cephalic Fetal monitoring Baseline 150 bpm, moderate variability, + accels, - decels  Uterine activity Contracting every 2 minutes  Dilation: 5.5 Effacement (%): 80 Station: -1 Exam by:: H. Flippin RN   Prenatal labs: ABO, Rh: --/--/A POS (08/29 1904) Antibody: NEG (08/29 1904) Rubella: 3.30 (04/11 1004) RPR: NON REACTIVE (08/29 1904)  HBsAg: Negative (04/11 1004)  HIV: Non Reactive (08/15 0834)  GBS: Negative/-- (10/24 1121)  1 hr Glucola 74 Genetic screening  low risk  Anatomy US wnl   Prenatal Transfer Tool  Maternal Diabetes: No Genetic Screening: Normal Maternal Ultrasounds/Referrals: Normal Fetal Ultrasounds or other Referrals:  None Maternal Substance Abuse:  No Significant Maternal Medications:  None Significant Maternal Lab Results:  Group B Strep negative Number of Prenatal Visits:greater  than 3 verified prenatal visits Other Comments:  None  No results found for this or any previous visit (from the past 24 hour(s)).  Patient Active Problem List   Diagnosis Date Noted   Preterm labor in third trimester 08/24/2022   History of anxiety 05/14/2022   Supervision of other normal pregnancy, antepartum 04/06/2022   Attention deficit disorder 03/10/2022   Rubella non-immune status, antepartum 12/18/2020   History of depression 06/12/2020    Assessment/Plan:  Lynn Moses is a 26 y.o. G2P1001 at [redacted]w[redacted]d here for SOL   #Labor: Expectant management  #Pain: IV fentanyl, had epidural last delivery. Doesn't want epidural now. #FWB: Cat 1  #ID:  GBS neg  #MOF: Breast until she goes back to school, then pumping/bottle #MOC: Depo  #Circ:  Girl   Lockie Mola, MD  11/09/2022, 7:52 AM   Fellow Attestation  I saw and evaluated the patient, performing the key elements of the service.I  personally performed or re-performed the history, physical exam, and medical decision making activities of this service and have verified that the service and findings are accurately documented in the resident's note. I developed the management plan that is described in the resident's note, and I agree with the content, with my edits above.    Derrel Nip, MD OB Fellow

## 2022-11-10 MED ORDER — ACETAMINOPHEN 500 MG PO TABS: 1000.0000 mg | ORAL_TABLET | Freq: Four times a day (QID) | ORAL | 0 refills | Status: AC | PRN

## 2022-11-10 MED ORDER — IBUPROFEN 600 MG PO TABS: 600.0000 mg | ORAL_TABLET | Freq: Four times a day (QID) | ORAL | 0 refills | Status: DC

## 2022-11-10 NOTE — Progress Notes (Signed)
CSW received consult for hx of Anxiety and Depression and edinburgh score 15.  CSW met with MOB to offer support and complete assessment, FOB present. CSW introduced self. MOB granted CSW verbal permission to speak in front of FOB about anything. CSW explained reason for consult. MOB was welcoming, pleasant, open, and remained engaged during assessment. CSW and MOB discussed MOB's mental health history. MOB reported that she was diagnosed with anxiety and depression in 2019. MOB reported that she is currently participating in virtual therapy with Haynes Kerns at [facility] [facility] [facility]) which is helpful. MOB reported that she has weekly or biweekly sessions depending on her needs/schedule. MOB verbalized a plan to continue therapy. CSW positively affirmed MOB's participation in therapy and plans to continue therapy. MOB endorsed a history of postpartum depression after having her son. MOB shared that she experienced several losses during that time and described the year as being overwhelming. MOB reported that her postpartum depression lasted a couple months to a year. MOB reported that she tried therapy and shared that it was hard to talk about things so she didn't participate as often as she needed to. MOB denied any additional mental health history. CSW and MOB discussed MOB's elevated edinburgh score 15. MOB attributed elevated score to multiple stressors (nursing school, pregnancy, and having a toddler). MOB explained that her emotions have been heightened recently. CSW acknowledged and validated MOB's feelings surrounding her experience. CSW explained that MOB may be more susceptible to postpartum depression due to her mental health history. CSW inquired about how MOB was feeling emotionally since giving birth, MOB reported that she was feeling okay. MOB presented calm and did not demonstrate any acute mental health signs/symptoms. CSW assessed for safety, MOB denied SI and HI. CSW did not assess for domestic  violence as MOB was not alone. CSW inquired about MOB's support system, MOB reported that FOB is a support and her mom and sister in [place] are also supports. MOB denied needing any additional resources.   CSW provided education regarding the baby blues period vs. perinatal mood disorders, discussed treatment and gave resources for mental health follow up if concerns arise.  CSW recommends self-evaluation during the postpartum time period using the New Mom Checklist from Postpartum Progress and encouraged MOB to contact a medical professional if symptoms are noted at any time.    CSW provided review of Sudden Infant Death Syndrome (SIDS) precautions. MOB verbalized understanding and reported having all items needed to care for infant including a car seat, basinet, and pack and play.   CSW identifies no further need for intervention and no barriers to discharge at this time.  Abundio Miu, [place] Worker [facility] Cell#: [phone]

## 2022-11-10 NOTE — Anesthesia Postprocedure Evaluation (Signed)
Anesthesia Post Note  Patient: Lynn Moses  Procedure(s) Performed: AN AD HOC LABOR EPIDURAL     Patient location during evaluation: Mother Baby Anesthesia Type: Epidural Level of consciousness: awake and alert Pain management: pain level controlled Vital Signs Assessment: post-procedure vital signs reviewed and stable Respiratory status: spontaneous breathing, nonlabored ventilation and respiratory function stable Cardiovascular status: stable Postop Assessment: no headache, no backache and epidural receding Anesthetic complications: no   No notable events documented.  Last Vitals:  Vitals:   11/10/22 0130 11/10/22 0545  BP: 121/70 107/75  Pulse: 75 83  Resp: 18 16  Temp: 36.8 C 36.9 C  SpO2: 99% 100%    Last Pain:  Vitals:   11/10/22 0545  TempSrc: Oral  PainSc: 0-No pain   Pain Goal:                   [name]

## 2022-11-16 ENCOUNTER — Encounter: Payer: No Typology Code available for payment source | Admitting: Advanced Practice Midwife

## 2022-11-16 ENCOUNTER — Telehealth (HOSPITAL_COMMUNITY): Payer: Self-pay | Admitting: *Deleted

## 2022-11-16 NOTE — Telephone Encounter (Signed)
Patient voiced no questions or concerns regarding her health at this time. EPDS not completed at patient's request. Stated, "I can tell you my answers are pretty much the same." Patient reported that she has an established relationship with a mental health provider and has an appointment scheduled for next week. Patient denied SI/HI. Patient voiced no questions or concerns regarding infant at this time. Patient reports infant sleeps in a Pack-N-Play on her back. RN reviewed ABCs of safe sleep. Patient verbalized understanding. Patient requested RN email information on hospital's virtual postpartum classes and support groups. Email sent. Deforest Hoyles, RN,

## 2022-12-14 ENCOUNTER — Other Ambulatory Visit (HOSPITAL_BASED_OUTPATIENT_CLINIC_OR_DEPARTMENT_OTHER): Payer: Self-pay

## 2022-12-14 ENCOUNTER — Ambulatory Visit (INDEPENDENT_AMBULATORY_CARE_PROVIDER_SITE_OTHER): Payer: No Typology Code available for payment source | Admitting: Family Medicine

## 2022-12-14 ENCOUNTER — Other Ambulatory Visit: Payer: Self-pay

## 2022-12-14 DIAGNOSIS — Z3042 Encounter for surveillance of injectable contraceptive: Secondary | ICD-10-CM

## 2022-12-14 DIAGNOSIS — Z30013 Encounter for initial prescription of injectable contraceptive: Secondary | ICD-10-CM

## 2022-12-14 MED ORDER — MEDROXYPROGESTERONE ACETATE 150 MG/ML IM SUSP: 150.0000 mg | INTRAMUSCULAR | 3 refills | Status: DC

## 2022-12-14 MED ORDER — MEDROXYPROGESTERONE ACETATE 150 MG/ML IM SUSY
150.0000 mg | PREFILLED_SYRINGE | INTRAMUSCULAR | 0 refills | Status: DC
  Filled 2022-12-14 – 2023-03-04 (×2): qty 1, 90d supply, fill #0

## 2022-12-14 MED ORDER — MEDROXYPROGESTERONE ACETATE 150 MG/ML IM SUSP
150.0000 mg | Freq: Once | INTRAMUSCULAR | Status: AC
  Administered 2022-12-14: 150 mg via INTRAMUSCULAR

## 2022-12-14 MED ORDER — MEDROXYPROGESTERONE ACETATE 150 MG/ML IM SUSY
150.0000 mg | PREFILLED_SYRINGE | INTRAMUSCULAR | 3 refills | Status: DC
  Filled 2022-12-14: qty 1, 90d supply, fill #0

## 2022-12-14 NOTE — Progress Notes (Signed)
Post Partum Visit Note  Lynn Moses is a 26 y.o. [phone] female who presents for a postpartum visit. She is 5 weeks postpartum following a normal spontaneous vaginal delivery.  I have fully reviewed the prenatal and intrapartum course. The delivery was at 39.1 gestational weeks.  Anesthesia: epidural. Postpartum course has been uneventful. Baby is doing well. Baby is feeding by both breast and bottle. Bleeding brown. Bowel function is normal. Bladder function is normal. Patient is not sexually active. Contraception method is Depo-Provera injections. Postpartum depression screening: negative.   Upstream - 12/14/22 1108       Pregnancy Intention Screening   Does the patient want to become pregnant in the next year? No    Does the patient's partner want to become pregnant in the next year? No    Would the patient like to discuss contraceptive options today? Yes      Contraception Wrap Up   Current Method Abstinence    End Method Hormonal Injection    Contraception Counseling Provided Yes    How was the end contraceptive method provided? Provided on site            The pregnancy intention screening data noted above was reviewed. Potential methods of contraception were discussed. The patient elected to proceed with Hormonal Injection.   Edinburgh Postnatal Depression Scale - 12/14/22 1108       Edinburgh Postnatal Depression Scale:  In the Past 7 Days   I have been able to laugh and see the funny side of things. 1    I have looked forward with enjoyment to things. 0    I have blamed myself unnecessarily when things went wrong. 2    I have been anxious or worried for no good reason. 2    I have felt scared or panicky for no good reason. 1    Things have been getting on top of me. 2    I have been so unhappy that I have had difficulty sleeping. 0    I have felt sad or miserable. 1    I have been so unhappy that I have been crying. 1    The thought of harming myself has  occurred to me. 0    Edinburgh Postnatal Depression Scale Total 10             Health Maintenance Due  Topic Date Due   HPV VACCINES (1 - 2-dose series) Never done   COVID-19 Vaccine (3 - 2023-24 season) 08/27/2022    The following portions of the patient's history were reviewed and updated as appropriate: allergies, current medications, past family history, past medical history, past social history, past surgical history, and problem list.  Review of Systems Pertinent items are noted in HPI.  Objective:  BP 118/72   Pulse 72   Wt 172 lb (78 kg)   LMP 01/26/2022 (Exact Date)   Breastfeeding Yes   BMI 27.76 kg/m    General:  alert, cooperative, and no distress   Breasts:  not indicated  Lungs: clear to auscultation bilaterally  Heart:  regular rate and rhythm, S1, S2 normal, no murmur, click, rub or gallop  Abdomen: soft, non-tender; bowel sounds normal; no masses,  no organomegaly   Wound N/a  GU exam:  not indicated       Assessment:   1. Postpartum exam    Plan:   Essential components of care per ACOG recommendations:  1.  Mood and well being: Patient  with negative depression screening today. Reviewed local resources for support.  - Patient tobacco use? No.   - hx of drug use? No.    2. Infant care and feeding:  -Patient currently breastmilk feeding? Yes. Reviewed importance of draining breast regularly to support lactation.  -Social determinants of health (SDOH) reviewed in EPIC. No concerns  3. Sexuality, contraception and birth spacing - Patient does not want a pregnancy in the next year.   - Reviewed reproductive life planning. Reviewed contraceptive methods based on pt preferences and effectiveness.  Patient desired Hormonal Injection today.   - Discussed birth spacing of 18 months  4. Sleep and fatigue -Encouraged family/partner/community support of 4 hrs of uninterrupted sleep to help with mood and fatigue  5. Physical Recovery  - Discussed  patients delivery and complications. She describes her labor as good. - Patient had a Vaginal, no problems at delivery. Patient had a 1st degree laceration. Perineal healing reviewed. Patient expressed understanding - Patient has urinary incontinence? No. - Patient is safe to resume physical and sexual activity  6.  Health Maintenance - HM due items addressed Yes - Last pap smear  Diagnosis  Date Value Ref Range Status  01/21/2021   Final   - Negative for intraepithelial lesion or malignancy (NILM)   Pap smear not done at today's visit.  -Breast Cancer screening indicated? No.   7. Chronic Disease/Pregnancy Condition follow up: None  - PCP follow up  [facility] [place]

## 2022-12-30 ENCOUNTER — Ambulatory Visit (INDEPENDENT_AMBULATORY_CARE_PROVIDER_SITE_OTHER): Payer: Medicaid Other | Admitting: Obstetrics and Gynecology

## 2022-12-30 ENCOUNTER — Encounter: Payer: Self-pay | Admitting: Obstetrics and Gynecology

## 2022-12-30 VITALS — BP 120/82 | HR 75 | Wt 168.0 lb

## 2022-12-30 DIAGNOSIS — N939 Abnormal uterine and vaginal bleeding, unspecified: Secondary | ICD-10-CM | POA: Diagnosis not present

## 2022-12-30 NOTE — Progress Notes (Signed)
Patient states that she had a lot of bleeding after intercourse.   Patient is 7 weeks postpartum.Patient is breastfeeding. Kathrene Alu RN

## 2022-12-30 NOTE — Progress Notes (Signed)
   RETURN GYNECOLOGY VISIT  Subjective:  Lynn Moses is a 27 y.o. [phone] with LMP 12/20/22 presenting for HVB after intercourse.   Reports bleeding started 12/25, was heavy like a period x 7d and then has tapered off over the past few days. Bleeding started 1-2d after having intercourse for the first time. She is 7wk PP, recently received Depo & is breastfeeding. Her lochia resolved prior to this episode. Denies n/v/f/c/lightheadedness/dizziness. No abdominal pain, no trauma with intercourse.   Objective:   Vitals:   12/30/22 1518  BP: 120/82  Pulse: 75  Weight: 168 lb (76.2 kg)   General:  Alert, oriented and cooperative. Patient is in no acute distress.  Skin: Skin is warm and dry. No rash noted.   Cardiovascular: Normal heart rate noted  Respiratory: Normal respiratory effort, no problems with respiration noted  Abdomen: Soft, nontender  Pelvic: Performed in presence of chaperone.    Assessment and Plan:  Lynn Moses is a 27 y.o. with vaginal bleeding - likely 2/2 first menstrual period vs iatrogenic from Depo.   No evidence of trauma or vaginal/cervical lesion on exam today. Last GC/CT negative 10/19/22, last pap NILM 01/21/21. 1st degree lac at delivery on 11/14, well healed. I personally reviewed the above testing and her delivery note.   Reviewed normal exam and prior testing with patient. Reassurance provided. Pt to follow up prn  Return if symptoms worsen or fail to improve.  Future Appointments  Date Time Provider [place]  03/04/2023  8:55 AM [facility] NURSE [facility] None   Lynn Catalina, MD

## 2023-03-04 ENCOUNTER — Ambulatory Visit: Payer: No Typology Code available for payment source

## 2023-03-04 ENCOUNTER — Ambulatory Visit (INDEPENDENT_AMBULATORY_CARE_PROVIDER_SITE_OTHER): Payer: Medicaid Other

## 2023-03-04 ENCOUNTER — Other Ambulatory Visit (HOSPITAL_BASED_OUTPATIENT_CLINIC_OR_DEPARTMENT_OTHER): Payer: Self-pay

## 2023-03-04 VITALS — BP 107/78 | HR 69 | Ht 66.0 in | Wt 168.0 lb

## 2023-03-04 DIAGNOSIS — Z3042 Encounter for surveillance of injectable contraceptive: Secondary | ICD-10-CM | POA: Diagnosis not present

## 2023-03-04 MED ORDER — MEDROXYPROGESTERONE ACETATE 150 MG/ML IM SUSP
150.0000 mg | Freq: Once | INTRAMUSCULAR | Status: AC
  Administered 2023-03-04: 150 mg via INTRAMUSCULAR

## 2023-03-04 NOTE — Progress Notes (Signed)
Date last pap: 01/01/21. Last Depo-Provera: 12/14/22. Side Effects if any: Pt tolerated well. Serum HCG indicated? N/A. Depo-Provera 150 mg IM given by: Yamilee Harmes, LPN in [place]. Next appointment due 5/24 - 6/7.

## 2023-03-11 NOTE — Progress Notes (Signed)
Patient was assessed and managed by nursing staff during this encounter. I have reviewed the chart and agree with the documentation and plan.   Darliss Cheney, MD 03/11/23 12:00 PM

## 2023-03-30 ENCOUNTER — Encounter (HOSPITAL_BASED_OUTPATIENT_CLINIC_OR_DEPARTMENT_OTHER): Payer: Self-pay | Admitting: Family Medicine

## 2023-03-30 ENCOUNTER — Ambulatory Visit (INDEPENDENT_AMBULATORY_CARE_PROVIDER_SITE_OTHER): Payer: No Typology Code available for payment source | Admitting: Family Medicine

## 2023-03-30 VITALS — BP 116/85 | HR 77 | Ht 66.0 in | Wt 174.0 lb

## 2023-03-30 DIAGNOSIS — M545 Low back pain, unspecified: Secondary | ICD-10-CM | POA: Diagnosis not present

## 2023-03-30 DIAGNOSIS — F32A Depression, unspecified: Secondary | ICD-10-CM | POA: Diagnosis not present

## 2023-03-30 DIAGNOSIS — F419 Anxiety disorder, unspecified: Secondary | ICD-10-CM

## 2023-03-30 NOTE — Progress Notes (Signed)
Acute Office Visit  Subjective:     Patient ID: Lynn Moses, female    DOB: Oct 29, 1996, 27 y.o.   MRN: [id number]  Chief Complaint  Patient presents with   Back Pain    Ongoing for 2 weeks, middle back, has tried [facility] & Stretches     Back Pain: Patient presents for presents evaluation of low back problems.  Symptoms have been present for 2 weeks and include stiffness in her upper back, more towards the right side, and her lower back midline . Initial inciting event: none. She notices symptoms . Has 26 year old and 54 month old-- hurts when she is rocking her or feeding her even when her back is supported. Alleviating factors identifiable by patient are  lying prone . Not taking any OTC meds. Exacerbating factors identifiable by patient are  sleeping on her side and reaching for something up high . Treatments so far initiated by patient: [facility] and heating pad. Previous lower back problems: none. Previous workup: none. Previous treatments: none.  She had back pain after she had her most recent child. Does not feel like a back pain she has had before.  Consistent, tight like somebody is squeezing and crushing pain  Always there but increase/decrease in pain. 3-4/10 at lowest. 8/10 pain in back.      03/30/2023   10:18 AM 10/19/2022   11:24 AM 08/10/2022    9:04 AM 05/14/2022    8:54 AM  GAD 7 : Generalized Anxiety Score  Nervous, Anxious, on Edge 3 3 3  0  Control/stop worrying 2 3 3  0  Worry too much - different things 3 3 3 1   Trouble relaxing 2 1 1  0  Restless 0 0 0 0  Easily annoyed or irritable 3 3 2 1   Afraid - awful might happen 2 1 2  0  Total GAD 7 Score 15 14 14 2   Anxiety Difficulty Somewhat difficult   Not difficult at all      03/30/2023   10:16 AM 10/19/2022   11:24 AM 08/10/2022    9:04 AM 05/14/2022    8:38 AM 04/06/2022    9:14 AM  Depression screen PHQ 2/9  Decreased Interest 2 3 3  0 3  Down, Depressed, Hopeless 2 2 3  0 3  PHQ - 2 Score 4 5 6  0 6   Altered sleeping 2 3 2 1 2   Tired, decreased energy 3 3 3 2 3   Change in appetite 2 1 1  0 3  Feeling bad or failure about yourself  2 1 2  0 1  Trouble concentrating 2 2 1  0 1  Moving slowly or fidgety/restless 2 0 0 0 0  Suicidal thoughts 0 0 0 0 1  PHQ-9 Score 17 15 15 3 17   Difficult doing work/chores Somewhat difficult   Not difficult at all     Review of Systems  Constitutional:  Negative for malaise/fatigue.  Respiratory:  Negative for cough and shortness of breath.   Cardiovascular:  Negative for chest pain and palpitations.  Gastrointestinal:  Negative for abdominal pain, nausea and vomiting.  Genitourinary:  Negative for urgency.  Musculoskeletal:  Positive for back pain. Negative for myalgias and neck pain.  Neurological:  Negative for dizziness, tingling, weakness and headaches.  Psychiatric/Behavioral:  Negative for depression and suicidal ideas.      Objective:    BP 116/85   Pulse 77   Ht 5\' 6"  (1.676 m)   Wt 174 lb (78.9  kg)   SpO2 99%   Breastfeeding Yes   BMI 28.08 kg/m   Physical Exam Constitutional:      Appearance: Normal appearance.  Cardiovascular:     Rate and Rhythm: Normal rate and regular rhythm.     Pulses: Normal pulses.  Pulmonary:     Effort: Pulmonary effort is normal.     Breath sounds: Normal breath sounds.  Musculoskeletal:     Thoracic back: Tenderness (upper R scapula) present.     Lumbar back: Tenderness (along lumbar spine and at base of spine midline) present. Negative right straight leg raise test and negative left straight leg raise test.       Back:    Assessment & Plan:  1. Acute right-sided low back pain without sciatica Patient reports history of right upper thoracic back pain, near her scapula, and midline lower back pain. She is unable to recall an incident that precipitated her back pain. Denies injury/trauma, bowel/bladder incontinence, numbness/tingling, unrelenting pain, loss of sensation, unexplained weight loss,   or neurological symptoms. No imaging indicated at this time. She has two young children that she picks up frequently but does not think she lifted them using improper body mechanics. No red flags present on exam. Discussed options, will do conservative management for 4-6 weeks. Use of tylenol is recommended for pain relief for nursing mothers. Advised to continue with heating pad for pain relief. Provided stretching exercises for back. Discussed options for further management including physical therapy, will determine further steps at follow-up appt.  2. Anxiety and depression Reviewed GAD7 results with a score of 15 and PHQ9 results with a score of 17. Denies SI/HI. 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 Briefly discussed this visit, since we were focused on discussion of back pain. Will readdress once she returns to office in May.   Return in about 4 weeks (around 04/27/2023) for back pain .  Les Pou, FNP

## 2023-05-16 ENCOUNTER — Encounter (HOSPITAL_BASED_OUTPATIENT_CLINIC_OR_DEPARTMENT_OTHER): Payer: No Typology Code available for payment source | Admitting: Nurse Practitioner

## 2023-05-24 ENCOUNTER — Other Ambulatory Visit: Payer: Self-pay

## 2023-05-24 MED ORDER — MEDROXYPROGESTERONE ACETATE 150 MG/ML IM SUSY: 150.0000 mg | PREFILLED_SYRINGE | INTRAMUSCULAR | 2 refills | Status: DC

## 2023-05-24 NOTE — Progress Notes (Signed)
Patient called stating she needs depo called to [facility] [address] and she has someone who is administering it for her. Explained if she could call us when it is adminstered so we can note it in her chart. Armandina Stammer RN

## 2023-06-01 ENCOUNTER — Ambulatory Visit: Payer: No Typology Code available for payment source | Admitting: Family Medicine

## 2023-06-01 ENCOUNTER — Ambulatory Visit: Payer: No Typology Code available for payment source

## 2023-07-14 ENCOUNTER — Ambulatory Visit (INDEPENDENT_AMBULATORY_CARE_PROVIDER_SITE_OTHER): Payer: PRIVATE HEALTH INSURANCE | Admitting: Family Medicine

## 2023-07-14 ENCOUNTER — Encounter (HOSPITAL_BASED_OUTPATIENT_CLINIC_OR_DEPARTMENT_OTHER): Payer: Self-pay | Admitting: Family Medicine

## 2023-07-14 VITALS — BP 124/89 | HR 100 | Ht 66.0 in | Wt 178.3 lb

## 2023-07-14 DIAGNOSIS — G8929 Other chronic pain: Secondary | ICD-10-CM

## 2023-07-14 DIAGNOSIS — F32A Depression, unspecified: Secondary | ICD-10-CM | POA: Diagnosis not present

## 2023-07-14 DIAGNOSIS — M545 Low back pain, unspecified: Secondary | ICD-10-CM | POA: Diagnosis not present

## 2023-07-14 DIAGNOSIS — F419 Anxiety disorder, unspecified: Secondary | ICD-10-CM | POA: Diagnosis not present

## 2023-07-14 MED ORDER — LIDOCAINE 4 % EX LOTN
1.0000 mL | TOPICAL_LOTION | Freq: Three times a day (TID) | CUTANEOUS | 2 refills | Status: DC | PRN
Start: 2023-07-14 — End: 2024-07-16

## 2023-07-14 MED ORDER — ESCITALOPRAM OXALATE 10 MG PO TABS
10.0000 mg | ORAL_TABLET | Freq: Every day | ORAL | 2 refills | Status: DC
Start: 2023-07-14 — End: 2023-08-25

## 2023-07-14 MED ORDER — ESCITALOPRAM OXALATE 20 MG PO TABS
20.0000 mg | ORAL_TABLET | Freq: Every day | ORAL | 2 refills | Status: DC
Start: 2023-07-14 — End: 2023-07-14

## 2023-07-14 NOTE — Progress Notes (Signed)
Established Patient Office Visit  Subjective   Patient ID: Lynn Moses, female    DOB: 01/14/1996  Age: 27 y.o. MRN: [id number]  Chief Complaint  Patient presents with   Anxiety    Has noticed anxiety worsening the past few weeks   ANXIETY: Lynn Moses is a 27 year-old female patient who presents for the medical management of anxiety. She reports she has noticed her anxiety increasing over the past few weeks. She is tearful during the visit and states she feels very overwhelmed. Her son was diagnosed with autism in January 2024 and he recently started therapy and she is struggling a lot. She was pregnant with her daughter when she reports noticing signs of autism and was unable to be there for him- since he would often be physically aggressive. Now that she can be there for him, she doesn't know what to do for him to help and feels very impatient. 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 Her symptoms include shakiness, brain fog, and emotional lability.    She was on medication 2019-2020 brought in old prescriptions-- looks like she has been on Lexapro 10mg  daily and has also tried sertraline 50mg  daily. She has also taken 50-100mg  trazodone. Currently not on medication. Reports that she did not notice a difference on these medications but is unsure how long she took them.   Counseling: yes, currently had to stop due to insurance issues  Denies SI/HI.      07/14/2023   10:29 AM 03/30/2023   10:18 AM 10/19/2022   11:24 AM 08/10/2022    9:04 AM  GAD 7 : Generalized Anxiety Score  Nervous, Anxious, on Edge 3 3 3 3   Control/stop worrying 3 2 3 3   Worry too much - different things 3 3 3 3   Trouble relaxing 1 2 1 1   Restless 1 0 0 0  Easily annoyed or irritable 3 3 3 2   Afraid - awful might happen 1 2 1 2   Total GAD 7 Score 15 15 14 14   Anxiety Difficulty Very difficult Somewhat difficult        07/14/2023   10:28 AM 03/30/2023   10:16  AM 10/19/2022   11:24 AM  PHQ9 SCORE ONLY  PHQ-9 Total Score 14 17 15     Review of Systems  Constitutional:  Negative for malaise/fatigue.  Eyes:  Negative for blurred vision and double vision.  Respiratory:  Negative for shortness of breath.   Cardiovascular:  Negative for chest pain.  Gastrointestinal:  Negative for abdominal pain, nausea and vomiting.  Neurological:  Negative for dizziness and headaches.  Psychiatric/Behavioral:  Negative for depression, substance abuse and suicidal ideas. The patient is nervous/anxious. The patient does not have insomnia.       Objective:     BP 124/89   Pulse 100   Ht 5\' 6"  (1.676 m)   Wt 178 lb 4.8 oz (80.9 kg)   SpO2 100%   Breastfeeding No   BMI 28.78 kg/m  BP Readings from Last 3 Encounters:  07/14/23 124/89  03/30/23 116/85  03/04/23 107/78     Physical Exam Constitutional:      Appearance: Normal appearance.  Cardiovascular:     Rate and Rhythm: Normal rate and regular rhythm.     Pulses: Normal pulses.     Heart sounds: Normal heart sounds.  Pulmonary:     Effort: Pulmonary effort is normal.     Breath sounds: Normal  breath sounds.  Neurological:     Mental Status: She is alert.  Psychiatric:        Attention and Perception: Attention and perception normal.        Mood and Affect: Mood normal. Affect is tearful.        Speech: Speech normal.        Judgment: Judgment normal.       Assessment & Plan:  1. Anxiety and depression Patient has a history of anxiety and depression. Reports noticing an increase in anxiety and depression in the past few weeks. She is tearful during the visit and feels overwhelmed by various life stressors- and would like to discuss medical management. GAD7 and PHQ9 completed. GAD7 with score of 15 and PHQ9 completed with score of 14. Denies SI/HI. Discussed options, would like to proceed with starting on Lexapro. Advised patient she can take 10mg  daily for 2 weeks and can increase to 20mg   after that duration. Educated about mechanism of action, common side effects, daily adherence to medication, and that improvement in mood may take 4-6 weeks. Plan to follow-up in 6 weeks for mood.   - escitalopram (LEXAPRO) 10 MG tablet; Take 1 tablet (10 mg total) by mouth daily.  Dispense: 60 tablet; Refill: 2  2. Chronic midline low back pain without sciatica She reports still experiencing back pain and does not have the time for physical therapy with her current schedule. She would like to trial lidocaine cream for pain relief, since she is having a difficult time picking up her children without back pain. Advised her to continue performing back stretches and utilizing proper body mechanics.  - Lidocaine 4 % LOTN; Apply 1 mL topically 3 (three) times daily as needed.  Dispense: 88 mL; Refill: 2   Return in about 6 weeks (around 08/25/2023) for Mood f/u.    Alyson Reedy, FNP

## 2023-08-17 ENCOUNTER — Ambulatory Visit: Payer: PRIVATE HEALTH INSURANCE

## 2023-08-25 ENCOUNTER — Telehealth (HOSPITAL_BASED_OUTPATIENT_CLINIC_OR_DEPARTMENT_OTHER): Payer: No Typology Code available for payment source | Admitting: Family Medicine

## 2023-08-25 ENCOUNTER — Encounter (HOSPITAL_BASED_OUTPATIENT_CLINIC_OR_DEPARTMENT_OTHER): Payer: Self-pay | Admitting: Family Medicine

## 2023-08-25 VITALS — Ht 66.0 in | Wt 170.0 lb

## 2023-08-25 DIAGNOSIS — F419 Anxiety disorder, unspecified: Secondary | ICD-10-CM | POA: Diagnosis not present

## 2023-08-25 DIAGNOSIS — F32A Depression, unspecified: Secondary | ICD-10-CM | POA: Diagnosis not present

## 2023-08-25 MED ORDER — ESCITALOPRAM OXALATE 20 MG PO TABS
20.0000 mg | ORAL_TABLET | Freq: Every day | ORAL | 2 refills | Status: DC
Start: 2023-08-25 — End: 2024-07-16

## 2023-08-25 NOTE — Progress Notes (Signed)
Virtual Visit via Video Note  I connected with Lynn Moses on 08/25/23 at 9:13 AM by a video enabled telemedicine application and verified that I am speaking with the correct person using two identifiers.  Patient Location: Home Provider Location: Office/Clinic  I discussed the limitations, risks, security, and privacy concerns of performing an evaluation and management service by video and the availability of in person appointments. I also discussed with the patient that there may be a patient responsible charge related to this service. The patient expressed understanding and agreed to proceed.  Subjective: PCP: Alyson Reedy, FNP  Chief Complaint  Patient presents with   Medical Management of Chronic Issues    6-week follow up; pt denies any concerns for today's visit.   ANXIETY: Lynn Moses is a 27 year-old female who presents for the medical management of anxiety.  Current medication regimen: Lexapro 10mg  daily for the first 3 weeks as we discussed, and started 20mg  daily after this, she does take it at night due to side effects of sleepiness. Reports she has decreased appetite.  She reports she is starting back in school and this is a new stressor  Does feels like her anxiety increases but she is able to control it with breathing Counseling: has been doing over a year, starting back up in September  Well controlled: yes & no- she reports she feels "about the same" but notices she is not as tearful and as overwhelmed as visit on 07/18 Denies SI/HI.      08/25/2023    8:50 AM 07/14/2023   10:29 AM 03/30/2023   10:18 AM 10/19/2022   11:24 AM  GAD 7 : Generalized Anxiety Score  Nervous, Anxious, on Edge 3 3 3 3   Control/stop worrying 3 3 2 3   Worry too much - different things 3 3 3 3   Trouble relaxing 1 1 2 1   Restless 0 1 0 0  Easily annoyed or irritable 3 3 3 3   Afraid - awful might happen 3 1 2 1   Total GAD 7 Score 16 15 15 14   Anxiety Difficulty  Somewhat difficult Very difficult Somewhat difficult       08/25/2023    8:49 AM 07/14/2023   10:28 AM 03/30/2023   10:16 AM  PHQ9 SCORE ONLY  PHQ-9 Total Score 7 14 17     ROS: Per HPI  Current Outpatient Medications:    ibuprofen (ADVIL) 600 MG tablet, Take 1 tablet (600 mg total) by mouth every 6 (six) hours., Disp: 30 tablet, Rfl: 0   Lidocaine 4 % LOTN, Apply 1 mL topically 3 (three) times daily as needed., Disp: 88 mL, Rfl: 2   medroxyPROGESTERone Acetate 150 MG/ML SUSY, Inject 1 mL (150 mg total) into the muscle every 3 (three) months., Disp: 1 mL, Rfl: 2   escitalopram (LEXAPRO) 20 MG tablet, Take 1 tablet (20 mg total) by mouth daily., Disp: 90 tablet, Rfl: 2   prenatal vitamin w/FE, FA (PRENATAL 1 + 1) 27-1 MG TABS tablet, Take 1 tablet by mouth daily at 12 noon., Disp: , Rfl:   Observations/Objective: Today's Vitals   08/25/23 0851  Weight: 170 lb (77.1 kg)  Height: 5\' 6"  (1.676 m)    General: Alert and oriented x 4. Speaking in clear and full sentences, no audible heavy breathing, no acute distress.  Sounds alert and appropriately interactive.  Appears well.  Face symmetric.  Extraocular movements intact.  Pupils equal and round.  No nasal flaring or accessory  muscle use visualized.  Assessment and Plan: 1. Anxiety and depression Patient presents today for mood follow-up. GAD7 score completed, with score of 16 (increased from 15), and PHQ9 completed with score of 7 (decreased from 14). Patient reports she feels a slight improvement in her mood with Lexapro 20mg  daily. 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 She reports she is going to begin cognitive behaviorals therapy (CBT) again in September. Discussed proven benefits of medication management along with CBT to help improve mood. Plan to continue lexparo 20mg  daily and follow-up in 8 weeks. Advised patient to schedule visit sooner if she feels an increase in her anxiety and/or depression.   Updated dose sent to pharmacy.  - escitalopram (LEXAPRO) 20 MG tablet; Take 1 tablet (20 mg total) by mouth daily.  Dispense: 90 tablet; Refill: 2   Follow Up Instructions: Return in about 8 weeks (around 10/20/2023) for Mood f/u (virtual is possible) .   I discussed the assessment and treatment plan with the patient. The patient was provided an opportunity to ask questions, and all were answered. The patient agreed with the plan and demonstrated an understanding of the instructions.   The patient was advised to call back or seek an in-person evaluation if the symptoms worsen or if the condition fails to improve as anticipated.  The above assessment and management plan was discussed with the patient. The patient verbalized understanding of and has agreed to the management plan.   Alyson Reedy, FNP

## 2023-09-05 ENCOUNTER — Telehealth: Payer: Self-pay

## 2023-09-05 NOTE — Telephone Encounter (Signed)
Depo Provera given to patient on 08/23/23 at her place of employment. Armandina Stammer RN

## 2023-09-05 NOTE — Telephone Encounter (Signed)
-----   Message from Madlyn Frankel sent at 09/01/2023  8:22 AM EDT ----- Regarding: Depo Shot Patient stated that she had a nurse at her job give her the depo shot on 08/23/23. Wanted to inform Victorino Dike.

## 2023-10-06 ENCOUNTER — Encounter: Payer: Self-pay | Admitting: Obstetrics & Gynecology

## 2023-10-06 ENCOUNTER — Other Ambulatory Visit (HOSPITAL_COMMUNITY)
Admission: RE | Admit: 2023-10-06 | Discharge: 2023-10-06 | Disposition: A | Discharge status: Home / Self Care | Payer: No Typology Code available for payment source | Source: Ambulatory Visit | Attending: Obstetrics & Gynecology | Admitting: Obstetrics & Gynecology

## 2023-10-06 ENCOUNTER — Ambulatory Visit: Payer: No Typology Code available for payment source | Admitting: Obstetrics & Gynecology

## 2023-10-06 VITALS — BP 118/75 | HR 69 | Wt 178.0 lb

## 2023-10-06 DIAGNOSIS — N921 Excessive and frequent menstruation with irregular cycle: Secondary | ICD-10-CM | POA: Insufficient documentation

## 2023-10-06 DIAGNOSIS — Z113 Encounter for screening for infections with a predominantly sexual mode of transmission: Secondary | ICD-10-CM | POA: Diagnosis present

## 2023-10-06 NOTE — Progress Notes (Deleted)
Patient ID: Lynn Moses, female   DOB: 1996-01-01, 27 y.o.   MRN: [id number]  No chief complaint on file.   HPI Lynn Moses is a 27 y.o. female.  *** HPI  Indications: Pap smear on {MONTH:10108} 20*** showed: {Findings; lab pap smear results:16707::"no abnormalities"}. Previous colposcopy: {Exam; colposcopy summary:733::in ***}. Prior cervical treatment: {Therapies; recommendations colposcopy:727}.  Past Medical History:  Diagnosis Date  . Anxiety   . COVID   . Depression 2020   pt has stopped taking her Zoloft when she was trying to get pregnant  . 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 Ovarian cyst 01/2020    Past Surgical History:  Procedure Laterality Date  . NO PAST SURGERIES      Family History  Problem Relation Age of Onset  . Varicose Veins Mother   . Heart disease Maternal Grandmother   . Hypertension Maternal Grandmother   . Cancer Paternal Grandmother     Social History Social History   Tobacco Use  . Smoking status: Former    Types: Cigarettes  . Smokeless tobacco: Never  Vaping Use  . Vaping status: Former  Substance Use Topics  . Alcohol use: Not Currently    Alcohol/week: 1.0 standard drink of alcohol    Types: 1 Glasses of wine per week    Comment: occassionally  . Drug use: Never    No Known Allergies  Current Outpatient Medications  Medication Sig Dispense Refill  . escitalopram (LEXAPRO) 20 MG tablet Take 1 tablet (20 mg total) by mouth daily. 90 tablet 2  . Lidocaine 4 % LOTN Apply 1 mL topically 3 (three) times daily as needed. 88 mL 2  . medroxyPROGESTERone Acetate 150 MG/ML SUSY Inject 1 mL (150 mg total) into the muscle every 3 (three) months. 1 mL 2  . prenatal vitamin w/FE, FA (PRENATAL 1 + 1) 27-1 MG TABS tablet Take 1 tablet by mouth daily at 12 noon.    Marland Kitchen ibuprofen (ADVIL) 600 MG tablet Take 1 tablet (600 mg total) by mouth every 6 (six) hours. (Patient not taking: Reported on 10/06/2023) 30 tablet 0    No current facility-administered medications for this visit.    Review of Systems Review of Systems  Blood pressure 118/75, pulse 69, weight 178 lb (80.7 kg), not currently breastfeeding.  Physical Exam Physical Exam  Data Reviewed ***  Assessment    Procedure Details  The risks and benefits of the procedure and Written informed consent obtained.  Speculum placed in vagina and excellent visualization of cervix achieved, cervix swabbed x 3 with acetic acid solution.  Specimens: ***  Complications: none.     Plan    Specimens labelled and sent to Pathology. Return to discuss Pathology results in 2 weeks.      Scheryl Darter 10/06/2023, 9:15 AM

## 2023-10-06 NOTE — Progress Notes (Signed)
Patient ID: Lynn Moses, female   DOB: 1996/11/27, 27 y.o.   MRN: [id number]  No chief complaint on file. Abnormal bleeding  HPI Lynn Moses is a 27 y.o. female.  [id number] She is 11 months postpartum and is using DMPA. She has frequent spotting and bleeding noted post coital and pain. Next DMPA in a month HPI  Past Medical History:  Diagnosis Date   Anxiety    COVID    Depression 2020   pt has stopped taking her Zoloft when she was trying to get pregnant   Mixed anxiety and depressive disorder 05/13/2020   Ovarian cyst 01/2020    Past Surgical History:  Procedure Laterality Date   NO PAST SURGERIES      Family History  Problem Relation Age of Onset   Varicose Veins Mother    Heart disease Maternal Grandmother    Hypertension Maternal Grandmother    Cancer Paternal Grandmother     Social History Social History   Tobacco Use   Smoking status: Former    Types: Cigarettes   Smokeless tobacco: Never  Vaping Use   Vaping status: Former  Substance Use Topics   Alcohol use: Not Currently    Alcohol/week: 1.0 standard drink of alcohol    Types: 1 Glasses of wine per week    Comment: occassionally   Drug use: Never    No Known Allergies  Current Outpatient Medications  Medication Sig Dispense Refill   escitalopram (LEXAPRO) 20 MG tablet Take 1 tablet (20 mg total) by mouth daily. 90 tablet 2   Lidocaine 4 % LOTN Apply 1 mL topically 3 (three) times daily as needed. 88 mL 2   medroxyPROGESTERone Acetate 150 MG/ML SUSY Inject 1 mL (150 mg total) into the muscle every 3 (three) months. 1 mL 2   prenatal vitamin w/FE, FA (PRENATAL 1 + 1) 27-1 MG TABS tablet Take 1 tablet by mouth daily at 12 noon.     ibuprofen (ADVIL) 600 MG tablet Take 1 tablet (600 mg total) by mouth every 6 (six) hours. (Patient not taking: Reported on 10/06/2023) 30 tablet 0   No current facility-administered medications for this visit.    Review of Systems Review of Systems   HENT: Negative.    Respiratory: Negative.    Cardiovascular: Negative.   Gastrointestinal: Negative.   Genitourinary:  Positive for dyspareunia and vaginal bleeding.    Blood pressure 118/75, pulse 69, weight 178 lb (80.7 kg), not currently breastfeeding.  Physical Exam Physical Exam Vitals and nursing note reviewed. Exam conducted with a chaperone present.  Constitutional:      Appearance: Normal appearance.  HENT:     Head: Normocephalic and atraumatic.  Cardiovascular:     Rate and Rhythm: Normal rate.  Pulmonary:     Effort: Pulmonary effort is normal.  Abdominal:     General: Abdomen is flat.     Palpations: Abdomen is soft.  Genitourinary:    General: Normal vulva.     Exam position: Lithotomy position.     Vagina: Vaginal discharge (brown mucus c/w old blood) present.     Uterus: Tender (minimal).      Adnexa: Right adnexa normal and left adnexa normal.  Neurological:     Mental Status: She is alert.  Psychiatric:        Mood and Affect: Mood normal.        Behavior: Behavior normal.     Data Reviewed   Assessment Breakthrough bleeding on depo provera -  Plan: Cervicovaginal ancillary only( [place]) R/u STD  Plan STD testing f/u. DMPA next month. Report if her sx worsen      Scheryl Darter 10/06/2023, 4:53 PM

## 2023-10-07 LAB — CERVICOVAGINAL ANCILLARY ONLY
Bacterial Vaginitis (gardnerella): NEGATIVE
Candida Glabrata: NEGATIVE
Candida Vaginitis: NEGATIVE
Chlamydia: NEGATIVE
Comment: NEGATIVE
Comment: NEGATIVE
Comment: NEGATIVE
Comment: NEGATIVE
Comment: NEGATIVE
Comment: NORMAL
Neisseria Gonorrhea: NEGATIVE
Trichomonas: NEGATIVE

## 2023-10-10 DIAGNOSIS — N921 Excessive and frequent menstruation with irregular cycle: Secondary | ICD-10-CM

## 2023-10-13 ENCOUNTER — Ambulatory Visit (HOSPITAL_BASED_OUTPATIENT_CLINIC_OR_DEPARTMENT_OTHER)
Admission: RE | Admit: 2023-10-13 | Discharge: 2023-10-13 | Disposition: A | Discharge status: Home / Self Care | Payer: PRIVATE HEALTH INSURANCE | Source: Ambulatory Visit | Attending: Family Medicine | Admitting: Family Medicine

## 2023-10-13 DIAGNOSIS — N921 Excessive and frequent menstruation with irregular cycle: Secondary | ICD-10-CM | POA: Diagnosis not present

## 2023-10-14 ENCOUNTER — Encounter: Payer: Self-pay | Admitting: Family Medicine

## 2023-10-17 ENCOUNTER — Other Ambulatory Visit: Payer: Self-pay | Admitting: Family Medicine

## 2023-10-17 MED ORDER — NORGESTIMATE-ETH ESTRADIOL 0.25-35 MG-MCG PO TABS
1.0000 | ORAL_TABLET | Freq: Every day | ORAL | 0 refills | Status: DC
Start: 2023-10-17 — End: 2023-10-18

## 2023-11-17 ENCOUNTER — Encounter (HOSPITAL_BASED_OUTPATIENT_CLINIC_OR_DEPARTMENT_OTHER): Payer: Self-pay | Admitting: Family Medicine

## 2023-11-30 DIAGNOSIS — F411 Generalized anxiety disorder: Secondary | ICD-10-CM | POA: Diagnosis not present

## 2023-12-07 DIAGNOSIS — F411 Generalized anxiety disorder: Secondary | ICD-10-CM | POA: Diagnosis not present

## 2023-12-10 ENCOUNTER — Other Ambulatory Visit: Payer: Self-pay | Admitting: Family Medicine

## 2023-12-12 ENCOUNTER — Other Ambulatory Visit: Payer: Self-pay

## 2023-12-12 DIAGNOSIS — Z3040 Encounter for surveillance of contraceptives, unspecified: Secondary | ICD-10-CM

## 2023-12-12 MED ORDER — NORGESTIMATE-ETH ESTRADIOL 0.25-35 MG-MCG PO TABS: 1.0000 | ORAL_TABLET | Freq: Every day | ORAL | 3 refills | Status: DC

## 2023-12-19 DIAGNOSIS — F411 Generalized anxiety disorder: Secondary | ICD-10-CM | POA: Diagnosis not present

## 2024-01-05 DIAGNOSIS — F411 Generalized anxiety disorder: Secondary | ICD-10-CM | POA: Diagnosis not present

## 2024-01-30 ENCOUNTER — Telehealth: Payer: No Typology Code available for payment source | Admitting: Family Medicine

## 2024-01-30 ENCOUNTER — Ambulatory Visit
Admission: EM | Admit: 2024-01-30 | Discharge: 2024-01-30 | Disposition: A | Discharge status: Home / Self Care | Payer: Medicaid Other

## 2024-01-30 DIAGNOSIS — R112 Nausea with vomiting, unspecified: Secondary | ICD-10-CM | POA: Diagnosis not present

## 2024-01-30 DIAGNOSIS — K529 Noninfective gastroenteritis and colitis, unspecified: Secondary | ICD-10-CM | POA: Diagnosis not present

## 2024-01-30 DIAGNOSIS — R197 Diarrhea, unspecified: Secondary | ICD-10-CM | POA: Diagnosis not present

## 2024-01-30 MED ORDER — ONDANSETRON 4 MG PO TBDP: 4.0000 mg | ORAL_TABLET | Freq: Three times a day (TID) | ORAL | 0 refills | Status: DC | PRN

## 2024-01-30 NOTE — Progress Notes (Signed)
 E-Visit for Diarrhea  We are sorry that you are not feeling well.  Here is how we plan to help!  Based on what you have shared with me it looks like you have Acute Infectious Diarrhea.  Most cases of acute diarrhea are due to infections with virus and bacteria and are self-limited conditions lasting less than 14 days.  For your symptoms you may take Imodium 2 mg tablets that are over the counter at your local pharmacy. Take two tablet now and then one after each loose stool up to 6 a day.  Antibiotics are not needed for most people with diarrhea.  Optional: Zofran 4 mg 1 tablet every 8 hours as needed for nausea and vomiting   HOME CARE We recommend changing your diet to help with your symptoms for the next few days. Drink plenty of fluids that contain water salt and sugar. Sports drinks such as Gatorade may help.  You may try broths, soups, bananas, applesauce, soft breads, mashed potatoes or crackers.  You are considered infectious for as long as the diarrhea continues. Hand washing or use of alcohol based hand sanitizers is recommend. It is best to stay out of work or school until your symptoms stop.   GET HELP RIGHT AWAY If you have dark yellow colored urine or do not pass urine frequently you should drink more fluids.   If your symptoms worsen  If you feel like you are going to pass out (faint) You have a new problem  MAKE SURE YOU  Understand these instructions. Will watch your condition. Will get help right away if you are not doing well or get worse.  Thank you for choosing an e-visit.  Your e-visit answers were reviewed by a board certified advanced clinical practitioner to complete your personal care plan. Depending upon the condition, your plan could have included both over the counter or prescription medications.  Please review your pharmacy choice. Make sure the pharmacy is open so you can pick up prescription now. If there is a problem, you may contact your provider  through [employer] and have the prescription routed to another pharmacy.  Your safety is important to [place]. If you have drug allergies check your prescription carefully.   For the next 24 hours you can use MyChart to ask questions about today's visit, request a non-urgent call back, or ask for a work or school excuse. You will get an email in the next two days asking about your experience. I hope that your e-visit has been valuable and will speed your recovery.    have provided 5 minutes of non face to face time during this encounter for chart review and documentation.

## 2024-01-30 NOTE — ED Triage Notes (Signed)
Pt presents with complaints of having stomach virus. Her family has had it. Pt is requesting school note.

## 2024-01-31 ENCOUNTER — Telehealth (HOSPITAL_BASED_OUTPATIENT_CLINIC_OR_DEPARTMENT_OTHER): Payer: Self-pay | Admitting: *Deleted

## 2024-01-31 DIAGNOSIS — F411 Generalized anxiety disorder: Secondary | ICD-10-CM | POA: Diagnosis not present

## 2024-01-31 NOTE — Telephone Encounter (Signed)
 Copied from CRM [id number]. 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 She states she previously spoke with someone this morning and was advised that it should be listed in her MyChart? Callback [phone]

## 2024-01-31 NOTE — Telephone Encounter (Signed)
We have not seen this patient for any sick visit. We cannot provide a note.

## 2024-02-01 NOTE — ED Provider Notes (Signed)
[facility]    CSN: [id number] Arrival date & time: 01/30/24  1705      History   Chief Complaint Chief Complaint  Patient presents with   Emesis    HPI Lynn Moses is a 28 y.o. female.   Patient complains of nausea vomiting and diarrhea.  Patient reports that she has missed school and needs a note.  Patient reports she is starting to feel some better.  She denies any fever or chills.  Patient is tolerating fluids without vomiting.  The history is provided by the patient. No language interpreter was used.  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 Authorizing Provider  escitalopram (LEXAPRO) 20 MG tablet Take 1 tablet (20 mg total) by mouth daily. 08/25/23   Alyson Reedy, FNP  ibuprofen (ADVIL) 600 MG tablet Take 1 tablet (600 mg total) by mouth every 6 (six) hours. Patient not taking: Reported on 10/06/2023 11/10/22   Linzie Collin, MD  Lidocaine 4 % LOTN Apply 1 mL topically 3 (three) times daily as needed. 07/14/23   Alyson Reedy, FNP  medroxyPROGESTERone Acetate 150 MG/ML SUSY Inject 1 mL (150 mg total) into the muscle every 3 (three) months. 05/24/23   Levie Heritage,  DO  MILI 0.25-35 MG-MCG tablet TAKE 1 TABLET BY MOUTH DAILY 12/14/23   Levie Heritage, DO  norgestimate-ethinyl estradiol (MILI) 0.25-35 MG-MCG tablet Take 1 tablet by mouth daily. 12/12/23   Reva Bores, MD  ondansetron (ZOFRAN-ODT) 4 MG disintegrating tablet Take 1 tablet (4 mg total) by mouth every 8 (eight) hours as needed for nausea or vomiting. 01/30/24   Delorse Lek, FNP  prenatal vitamin w/FE, FA (PRENATAL 1 + 1) 27-1 MG TABS tablet Take 1 tablet by mouth daily at 12 noon.    [provider]    Family History Family History  Problem Relation Age of Onset   Varicose Veins Mother    Heart disease Maternal Grandmother    Hypertension Maternal Grandmother    Cancer Paternal Grandmother     Social History Social History   Tobacco Use   Smoking status: Former    Types: Cigarettes   Smokeless tobacco: Never  Vaping Use   Vaping status: Former  Substance Use Topics   Alcohol use: Not Currently    Alcohol/week: 1.0 standard drink of  alcohol    Types: 1 Glasses of wine per week    Comment: occassionally   Drug use: Never     Allergies   Patient has no known allergies.   Review of Systems Review of Systems  Gastrointestinal:  Positive for vomiting.  All other systems reviewed and are negative.    Physical Exam Triage Vital Signs ED Triage Vitals  Encounter Vitals Group     BP 01/30/24 1942 115/77     Systolic BP Percentile --      Diastolic BP Percentile --      Pulse Rate 01/30/24 1942 80     Resp 01/30/24 1942 18     Temp 01/30/24 1942 98 F (36.7 C)     Temp src --      SpO2 01/30/24 1942 96 %     Weight --      Height --      Head Circumference --      Peak Flow --      Pain Score 01/30/24 1940 0     Pain Loc --      Pain Education --      Exclude from Growth Chart --    No data found.  Updated Vital Signs BP 115/77   Pulse 80   Temp 98 F (36.7 C)   Resp 18   LMP 01/17/2024   SpO2 96%   Visual Acuity Right Eye Distance:    Left Eye Distance:   Bilateral Distance:    Right Eye Near:   Left Eye Near:    Bilateral Near:     Physical Exam Vitals reviewed.  Constitutional:      Appearance: Normal appearance.  HENT:     Right Ear: Tympanic membrane normal.     Left Ear: Tympanic membrane normal.     Nose: Nose normal.     Mouth/Throat:     Mouth: Mucous membranes are moist.  Cardiovascular:     Rate and Rhythm: Normal rate.  Pulmonary:     Effort: Pulmonary effort is normal.  Abdominal:     General: Abdomen is flat.     Palpations: Abdomen is soft.  Musculoskeletal:        General: Normal range of motion.  Skin:    General: Skin is warm.  Neurological:     General: No focal deficit present.     Mental Status: She is alert.  Psychiatric:        Mood and Affect: Mood normal.      UC Treatments / Results  Labs (all labs ordered are listed, but only abnormal results are displayed) Labs Reviewed - No data to display  EKG   Radiology No results found.  Procedures Procedures (including critical care time)  Medications Ordered in UC Medications - No data to display  Initial Impression / Assessment and Plan / UC Course  I have reviewed the triage vital signs and the nursing notes.  Pertinent labs & imaging results that were available during my care of the patient were reviewed by me and considered in my medical decision making (see chart for details).      Final Clinical Impressions(s) / UC Diagnoses   Final diagnoses:  Nausea and vomiting, unspecified vomiting type  Diarrhea, unspecified type   Discharge Instructions   None    ED Prescriptions   None    PDMP not reviewed this encounter. An After Visit Summary was printed and given to the patient.  Elson Areas, [place] 02/01/24 1600

## 2024-03-24 ENCOUNTER — Telehealth: Payer: PRIVATE HEALTH INSURANCE | Admitting: Physician Assistant

## 2024-03-24 DIAGNOSIS — A084 Viral intestinal infection, unspecified: Secondary | ICD-10-CM | POA: Diagnosis not present

## 2024-03-24 MED ORDER — ONDANSETRON 4 MG PO TBDP: 4.0000 mg | ORAL_TABLET | Freq: Three times a day (TID) | ORAL | 0 refills | Status: DC | PRN

## 2024-03-24 NOTE — Progress Notes (Signed)

## 2024-04-16 ENCOUNTER — Telehealth: Payer: PRIVATE HEALTH INSURANCE | Admitting: Family Medicine

## 2024-04-16 DIAGNOSIS — J309 Allergic rhinitis, unspecified: Secondary | ICD-10-CM | POA: Diagnosis not present

## 2024-04-16 MED ORDER — CETIRIZINE HCL 10 MG PO TABS: 10.0000 mg | ORAL_TABLET | Freq: Every day | ORAL | 0 refills | Status: DC

## 2024-04-16 MED ORDER — FLUTICASONE PROPIONATE 50 MCG/ACT NA SUSP: 2.0000 | Freq: Every day | NASAL | 0 refills | Status: DC

## 2024-04-16 NOTE — Progress Notes (Signed)
 E visit for Allergic Rhinitis We are sorry that you are not feeling well.  Here is how we plan to help!  Based on what you have shared with me it looks like you have Allergic Rhinitis.  Rhinitis is when a reaction occurs that causes nasal congestion, runny nose, sneezing, and itching.  Most types of rhinitis are caused by an inflammation and are associated with symptoms in the eyes ears or throat. There are several types of rhinitis.  The most common are acute rhinitis, which is usually caused by a viral illness, allergic or seasonal rhinitis, and nonallergic or year-round rhinitis.  Nasal allergies occur certain times of the year.  Allergic rhinitis is caused when allergens in the air trigger the release of histamine in the body.  Histamine causes itching, swelling, and fluid to build up in the fragile linings of the nasal passages, sinuses and eyelids.  An itchy nose and clear discharge are common.  I recommend the following over the counter treatments: I have sent Cetirizine  10 mg daily, however, most allergy medications if not covered by insurance may be purchased over the counter.   I also would recommend a nasal spray: Flonase  2 sprays into each nostril once daily   HOME CARE:  You can use an over-the-counter saline nasal spray as needed Avoid areas where there is heavy dust, mites, or molds Stay indoors on windy days during the pollen season Keep windows closed in home, at least in bedroom; use air conditioner. Use high-efficiency house air filter Keep windows closed in car, turn AC on re-circulate Avoid playing out with dog during pollen season  GET HELP RIGHT AWAY IF:  If your symptoms do not improve within 10 days You become short of breath You develop yellow or green discharge from your nose for over 3 days You have coughing fits  MAKE SURE YOU:  Understand these instructions Will watch your condition Will get help right away if you are not doing well or get  worse  Thank you for choosing an e-visit. Your e-visit answers were reviewed by a board certified advanced clinical practitioner to complete your personal care plan. Depending upon the condition, your plan could have included both over the counter or prescription medications. Please review your pharmacy choice. Be sure that the pharmacy you have chosen is open so that you can pick up your prescription now.  If there is a problem you may message your provider in MyChart to have the prescription routed to another pharmacy. Your safety is important to us . If you have drug allergies check your prescription carefully.  For the next 24 hours, you can use MyChart to ask questions about today's visit, request a non-urgent call back, or ask for a work or school excuse from your e-visit provider. You will get an email in the next two days asking about your experience. I hope that your e-visit has been valuable and will speed your recovery.   I have spent 5 minutes in review of e-visit questionnaire, review and updating patient chart, medical decision making and response to patient.   Louvenia Roys, PA-C

## 2024-05-15 DIAGNOSIS — F411 Generalized anxiety disorder: Secondary | ICD-10-CM | POA: Diagnosis not present

## 2024-05-22 DIAGNOSIS — F411 Generalized anxiety disorder: Secondary | ICD-10-CM | POA: Diagnosis not present

## 2024-05-31 DIAGNOSIS — F411 Generalized anxiety disorder: Secondary | ICD-10-CM | POA: Diagnosis not present

## 2024-06-11 DIAGNOSIS — F411 Generalized anxiety disorder: Secondary | ICD-10-CM | POA: Diagnosis not present

## 2024-06-18 DIAGNOSIS — F411 Generalized anxiety disorder: Secondary | ICD-10-CM | POA: Diagnosis not present

## 2024-06-21 DIAGNOSIS — F411 Generalized anxiety disorder: Secondary | ICD-10-CM | POA: Diagnosis not present

## 2024-06-25 DIAGNOSIS — F411 Generalized anxiety disorder: Secondary | ICD-10-CM | POA: Diagnosis not present

## 2024-07-02 DIAGNOSIS — F411 Generalized anxiety disorder: Secondary | ICD-10-CM | POA: Diagnosis not present

## 2024-07-09 ENCOUNTER — Other Ambulatory Visit: Payer: Self-pay | Admitting: Medical Genetics

## 2024-07-10 DIAGNOSIS — F411 Generalized anxiety disorder: Secondary | ICD-10-CM | POA: Diagnosis not present

## 2024-07-11 ENCOUNTER — Other Ambulatory Visit (HOSPITAL_COMMUNITY)
Admission: RE | Admit: 2024-07-11 | Discharge: 2024-07-11 | Disposition: A | Discharge status: Home / Self Care | Payer: Self-pay | Source: Ambulatory Visit | Attending: Medical Genetics | Admitting: Medical Genetics

## 2024-07-16 ENCOUNTER — Ambulatory Visit (INDEPENDENT_AMBULATORY_CARE_PROVIDER_SITE_OTHER)

## 2024-07-16 VITALS — BP 107/74 | HR 91 | Temp 98.5°F | Resp 16 | Ht 66.0 in | Wt 171.6 lb

## 2024-07-16 DIAGNOSIS — Z3009 Encounter for other general counseling and advice on contraception: Secondary | ICD-10-CM | POA: Diagnosis not present

## 2024-07-16 DIAGNOSIS — Z7689 Persons encountering health services in other specified circumstances: Secondary | ICD-10-CM

## 2024-07-16 DIAGNOSIS — F419 Anxiety disorder, unspecified: Secondary | ICD-10-CM

## 2024-07-16 DIAGNOSIS — Z124 Encounter for screening for malignant neoplasm of cervix: Secondary | ICD-10-CM

## 2024-07-16 DIAGNOSIS — F418 Other specified anxiety disorders: Secondary | ICD-10-CM

## 2024-07-16 NOTE — Progress Notes (Signed)
 Patient ID: Lynn Moses, female    DOB: 23-Nov-1996  MRN: [id number]  CC: Establish Care (Patient is here to established care with provider./~health hx address/~care gaps address/ )   Subjective: Lynn Moses is a 27 y.o. female with past medical history of anxiety and depression who presents to clinic to establish care.  Main concern today is anxiety and history of depression.  Patient reports she is not taking any medications at this time.  She sees a therapist once a week, which she finds helpful.  She is interested in starting medication for anxiety again.  Also reports associated attention deficits/distractibility both at home and work. She discontinue use of contraception pills due to side effects whenever she smokes cigarettes.  She is not interested in starting a different medication at this time.  Reports 1 sexual partner (fiance) No current plans for pregnancy but would like to in the future. Has 2 children. Regarding health maintenance she sees a gynecologist for her Pap smears.  Last Pap smear was last year, unable to recall the exact date   Patient Active Problem List   Diagnosis Date Noted   Anxiety 08/25/2023   Anxiety and depression 08/25/2023   History of anxiety 05/14/2022   Attention deficit disorder 03/10/2022   History of depression 06/12/2020     No Known Allergies  Social History   Socioeconomic History   Marital status: Media planner    Spouse name: Not on file   Number of children: 1   Years of education: 13   Highest education level: Bachelor's degree (e.g., BA, AB, BS)  Occupational History   Not on file  Tobacco Use   Smoking status: Every Day    Types: Cigarettes   Smokeless tobacco: Never  Vaping Use   Vaping status: Former  Substance and Sexual Activity   Alcohol use: Not Currently    Alcohol/week: 1.0 standard drink of alcohol    Types: 1 Glasses of wine per week    Comment: occassionally   Drug use: Never   Sexual activity:  Yes    Birth control/protection: None  Other Topics Concern   Not on file  Social History Narrative   Not on file   Social Drivers of Health   Financial Resource Strain: Low Risk  (07/12/2024)   Overall Financial Resource Strain (CARDIA)    Difficulty of Paying Living Expenses: Not very hard  Food Insecurity: No Food Insecurity (07/12/2024)   Hunger Vital Sign    Worried About Running Out of Food in the Last Year: Never true    Ran Out of Food in the Last Year: Never true  Transportation Needs: No Transportation Needs (07/12/2024)   PRAPARE - Administrator, Civil Service (Medical): No    Lack of Transportation (Non-Medical): No  Physical Activity: Inactive (07/12/2024)   Exercise Vital Sign    Days of Exercise per Week: 0 days    Minutes of Exercise per Session: Not on file  Stress: Stress Concern Present (07/12/2024)   [employer] of Occupational Health - Occupational Stress Questionnaire    Feeling of Stress: Very much  Social Connections: Socially Isolated (07/12/2024)   Social Connection and Isolation Panel    Frequency of Communication with Friends and Family: Twice a week    Frequency of Social Gatherings with Friends and Family: Never    Attends Religious Services: Never    Database administrator or Organizations: No    Attends Banker Meetings:  Not on file    Marital Status: Living with partner  Intimate Partner Violence: Not At Risk (11/09/2022)   Humiliation, Afraid, Rape, and Kick questionnaire    Fear of Current or Ex-Partner: No    Emotionally Abused: No    Physically Abused: No    Sexually Abused: No    Family History  Problem Relation Age of Onset   Varicose Veins Mother    Heart disease Maternal Grandmother    Hypertension Maternal Grandmother    Cancer Paternal Grandmother      ROS: Review of Systems  Constitutional:  Negative for fever.  Respiratory:  Negative for chest tightness and shortness of breath.    Cardiovascular:  Negative for chest pain.  Gastrointestinal:  Negative for abdominal pain.  Psychiatric/Behavioral:  Positive for decreased concentration. Negative for suicidal ideas. The patient is nervous/anxious.    Negative except as stated above  PHYSICAL EXAM: BP 107/74   Pulse 91   Temp 98.5 F (36.9 C) (Oral)   Resp 16   Ht 5' 6 (1.676 m)   Wt 171 lb 9.6 oz (77.8 kg)   SpO2 96%   BMI 27.70 kg/m   Physical Exam  General appearance - alert, well appearing, and in no distress Mental status - alert, oriented to person, place, and time, anxious Chest - clear to auscultation, no wheezes, rales or rhonchi, symmetric air entry Heart - normal rate, regular rhythm, normal S1, S2, no murmurs, rubs, clicks or gallops Skin - normal coloration and turgor, no rashes, no suspicious skin lesions noted   ASSESSMENT AND PLAN:  1. Encounter to establish care with new provider (Primary) - Establishing care today. Plan for yearly physical and labs in a month  2. Anxiety, depression, attention deficit - Pt sees a therapist once a week, which she finds helpful - Ambulatory referral to Psychiatry for diagnosis and medication initiation  3. Encounter for other general counseling or advice on contraception - Currently is not taking any contraception medication due to side effects. Advised to use other forms of contraception in the meantime such as condoms.  4. Cervical cancer screening - Discussed guidelines for cervical cancer screening - Regularly sees a gynecologist, Last Pap Smear was last year.     Patient was given the opportunity to ask questions.  Patient verbalized understanding of the plan and was able to repeat key elements of the plan.   Orders Placed This Encounter  Procedures   Ambulatory referral to Psychiatry     Return in about 4 weeks (around 08/13/2024) for Physical and labs.  Sula Leavy Rode, PA-C

## 2024-07-19 DIAGNOSIS — F411 Generalized anxiety disorder: Secondary | ICD-10-CM | POA: Diagnosis not present

## 2024-07-23 LAB — GENECONNECT MOLECULAR SCREEN: Genetic Analysis Overall Interpretation: NEGATIVE

## 2024-07-25 DIAGNOSIS — F411 Generalized anxiety disorder: Secondary | ICD-10-CM | POA: Diagnosis not present

## 2024-08-09 ENCOUNTER — Telehealth: Payer: Self-pay

## 2024-08-09 NOTE — Telephone Encounter (Signed)
 Copied from CRM [id number]. Topic: Referral - Status >> Aug 08, 2024  2:16 PM Charlet HERO wrote: Reason for CRM: patient is calling about referral to phsycatrist she is stating that she has not heard from them and the Dr informed her to reach out if she did not hear from them she sent msg to Dr and has not heard from him.

## 2024-08-10 ENCOUNTER — Telehealth: Payer: Self-pay

## 2024-08-10 NOTE — Telephone Encounter (Signed)
 Copied from CRM #[id number]. Topic: Referral - Status >> Aug 10, 2024 12:00 PM Pinkey ORN wrote: Reason for CRM: Referral Status >> Aug 10, 2024 12:01 PM Pinkey ORN wrote: Patient states she hasn't heard anything back in regards to her consult. Please contact patient at .[phone]

## 2024-08-10 NOTE — Telephone Encounter (Signed)
 Called patient unable to make contact, voicemail has been left

## 2024-08-10 NOTE — Telephone Encounter (Signed)
 When pt calls back in please provide her with this information, she can call and schedule her an appointment     Placed in [facility]  [address] [place] [address]  [address] Ph [phone]

## 2024-08-13 ENCOUNTER — Encounter: Payer: Self-pay | Admitting: *Deleted

## 2024-08-13 NOTE — Telephone Encounter (Signed)
Thank you [name]!!!!!

## 2024-08-13 NOTE — Telephone Encounter (Signed)
 Information sent to patient my chart

## 2024-08-14 ENCOUNTER — Other Ambulatory Visit (HOSPITAL_COMMUNITY)
Admission: RE | Admit: 2024-08-14 | Discharge: 2024-08-14 | Disposition: A | Discharge status: Home / Self Care | Source: Ambulatory Visit | Attending: Obstetrics and Gynecology | Admitting: Obstetrics and Gynecology

## 2024-08-14 ENCOUNTER — Ambulatory Visit (INDEPENDENT_AMBULATORY_CARE_PROVIDER_SITE_OTHER): Admitting: Obstetrics and Gynecology

## 2024-08-14 VITALS — BP 104/75 | HR 73 | Ht 66.0 in | Wt 177.0 lb

## 2024-08-14 DIAGNOSIS — Z124 Encounter for screening for malignant neoplasm of cervix: Secondary | ICD-10-CM

## 2024-08-14 DIAGNOSIS — Z01419 Encounter for gynecological examination (general) (routine) without abnormal findings: Secondary | ICD-10-CM | POA: Insufficient documentation

## 2024-08-14 DIAGNOSIS — Z1331 Encounter for screening for depression: Secondary | ICD-10-CM | POA: Diagnosis not present

## 2024-08-14 DIAGNOSIS — Z30011 Encounter for initial prescription of contraceptive pills: Secondary | ICD-10-CM

## 2024-08-14 DIAGNOSIS — Z8659 Personal history of other mental and behavioral disorders: Secondary | ICD-10-CM

## 2024-08-14 LAB — POCT URINE PREGNANCY: Preg Test, Ur: NEGATIVE

## 2024-08-14 MED ORDER — SLYND 4 MG PO TABS: 1.0000 | ORAL_TABLET | Freq: Every day | ORAL | 4 refills | Status: AC

## 2024-08-14 NOTE — Progress Notes (Signed)
 ANNUAL EXAM Patient name: Lynn Moses MRN [id number]  Date of birth: 01/17/1996 Chief Complaint:   No chief complaint on file.  History of Present Illness:   Lynn Moses is a 28 y.o. [phone]  female being seen today for a routine annual exam.  Current complaints: has been dealing with anxiety, just finished nursing school, still present, finds self spacing all the time, concerned about ADD She has tried many anxiety medications previously that did not help, has a therapist she sees regularly  Saw her pcp who referred her to psych  Started smoking cigarettes d/t stress and stopped birth control after that. She is not currently sexually active, but also not using any contraception  In the past she has tried depo, nexplanon, ocps  Patient's last menstrual period was 07/30/2024 (exact date).   The pregnancy intention screening data noted above was reviewed. Potential methods of contraception were discussed. The patient elected to proceed with POPs  Gynecologic History Patient's last menstrual period was . Contraception:  Last Pap: 2022. Results were: Normal Last mammogram: n/a     08/14/2024   11:26 AM 07/16/2024    1:14 PM 08/25/2023    8:49 AM 07/14/2023   10:28 AM 03/30/2023   10:16 AM  Depression screen PHQ 2/9  Decreased Interest 2 2 1 1 2   Down, Depressed, Hopeless 3 3 1 1 2   PHQ - 2 Score 5 5 2 2 4   Altered sleeping 1 3 0 1 2  Tired, decreased energy 3 3 2 3 3   Change in appetite 1 2 1  0 2  Feeling bad or failure about yourself  3 3 1 3 2   Trouble concentrating 1 3 1 2 2   Moving slowly or fidgety/restless 1 2 0 3 2  Suicidal thoughts   0 0 0  PHQ-9 Score 15 21 7 14 17   Difficult doing work/chores  Somewhat difficult Somewhat difficult Very difficult Somewhat difficult        08/14/2024   11:27 AM 07/16/2024    1:14 PM 08/25/2023    8:50 AM 07/14/2023   10:29 AM  GAD 7 : Generalized Anxiety Score  Nervous, Anxious, on Edge 3 3 3 3   Control/stop  worrying 3 3 3 3   Worry too much - different things 3 3 3 3   Trouble relaxing 1 2 1 1   Restless 1 1 0 1  Easily annoyed or irritable 3 3 3 3   Afraid - awful might happen 2 2 3 1   Total GAD 7 Score 16 17 16 15   Anxiety Difficulty  Somewhat difficult Somewhat difficult Very difficult     Review of Systems:   Pertinent items are noted in HPI Denies any headaches, blurred vision, fatigue, shortness of breath, chest pain, abdominal pain, abnormal vaginal discharge/itching/odor/irritation, problems with periods, bowel movements, urination, or intercourse unless otherwise stated above. Pertinent History Reviewed:  Reviewed past medical,surgical, social and family history.  Reviewed problem list, medications and allergies. Physical Assessment:   Vitals:   08/14/24 1121  BP: 104/75  Pulse: 73  Weight: 177 lb (80.3 kg)  Height: 5' 6 (1.676 m)  Body mass index is 28.57 kg/m.        Physical Examination:   General appearance - well appearing, and in no distress  Mental status - alert, oriented   Psych:  She has a normal mood and affect  Skin - warm and dry, normal color  Chest - effort normal, all lung fields clear to auscultation  bilaterally  Heart - normal rate and regular rhythm  Neck:  midline trachea  Breasts - breasts appear normal, no suspicious masses, no skin or nipple changes or  axillary nodes  Abdomen - soft, nontender, nondistended  Pelvic - VULVA: normal appearing vulva with no masses, tenderness or lesions  VAGINA: normal appearing vagina with normal color and discharge, no lesions  CERVIX: normal appearing cervix without discharge or lesions, no CMT  Thin prep pap is done   Extremities:  No swelling or varicosities noted  Chaperone present for exam  No results found for this or any previous visit (from the past 24 hours).  Assessment & Plan:  1. Well woman exam with routine gynecological exam (Primary) Pap today Mammogram at 41  - Cytology - PAP( [facility])  2. Encounter for initial prescription of contraceptive pills Discussed options for birth control, she has tried nexplanon and depo and pills previously. Discussed POP given hx anxiety and smoking She is interested in POPs, discussed initiation,  missed pill window  UPT neg - Drospirenone  (SLYND ) 4 MG TABS; Take 1 tablet (4 mg total) by mouth daily.  Dispense: 84 tablet; Refill: 4 - POCT urine pregnancy  3. History of anxiety Seeing therapist, discussed other option for anxiety such as Prozac, she has not tried before. Also discussed hydroxyzine  PRN Discussed potentially using gene site testing to determine tolerance to meds given she has tried multiple  She is going to think about meds and let me know if she is interested   4. Cervical cancer screening  - Cytology - PAP( [place])   Labs/procedures today:   Mammogram: @ 28yo, or sooner if problems  Orders Placed This Encounter  Procedures   POCT urine pregnancy    Meds:  Meds ordered this encounter  Medications   Drospirenone  (SLYND ) 4 MG TABS    Sig: Take 1 tablet (4 mg total) by mouth daily.    Dispense:  84 tablet    Refill:  4    Follow-up: Return in about 1 year (around 08/14/2025) for [place] Nidia Delores, FNP

## 2024-08-15 ENCOUNTER — Ambulatory Visit: Payer: Self-pay | Admitting: Obstetrics and Gynecology

## 2024-08-15 LAB — CYTOLOGY - PAP: Diagnosis: NEGATIVE

## 2024-08-18 ENCOUNTER — Ambulatory Visit (HOSPITAL_COMMUNITY): Payer: Self-pay | Admitting: Psychiatry

## 2024-08-18 ENCOUNTER — Encounter (HOSPITAL_COMMUNITY): Payer: Self-pay

## 2024-08-23 DIAGNOSIS — F411 Generalized anxiety disorder: Secondary | ICD-10-CM | POA: Diagnosis not present

## 2024-09-05 DIAGNOSIS — F411 Generalized anxiety disorder: Secondary | ICD-10-CM | POA: Diagnosis not present

## 2024-09-08 ENCOUNTER — Ambulatory Visit (HOSPITAL_BASED_OUTPATIENT_CLINIC_OR_DEPARTMENT_OTHER): Admitting: Family

## 2024-09-08 DIAGNOSIS — F32A Depression, unspecified: Secondary | ICD-10-CM

## 2024-09-08 DIAGNOSIS — F419 Anxiety disorder, unspecified: Secondary | ICD-10-CM | POA: Diagnosis not present
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
## 2024-09-13 DIAGNOSIS — F411 Generalized anxiety disorder: Secondary | ICD-10-CM | POA: Diagnosis not present

## 2024-10-02 ENCOUNTER — Telehealth (HOSPITAL_COMMUNITY): Admitting: Family

## 2024-10-02 ENCOUNTER — Other Ambulatory Visit (HOSPITAL_COMMUNITY): Payer: Self-pay

## 2024-10-02 DIAGNOSIS — F32A Depression, unspecified: Secondary | ICD-10-CM | POA: Diagnosis not present

## 2024-10-02 DIAGNOSIS — F419 Anxiety disorder, unspecified: Secondary | ICD-10-CM | POA: Diagnosis not present

## 2024-10-02 MED ORDER — BUPROPION HCL ER (SR) 100 MG PO TB12
100.0000 mg | ORAL_TABLET | Freq: Every day | ORAL | 0 refills | Status: AC
  Filled 2024-10-02: qty 30, 30d supply, fill #0

## 2024-10-02 NOTE — Progress Notes (Unsigned)
 Virtual Visit via Video Note  I connected with Lynn Moses on 10/02/24 at  7:30 AM EDT by a video enabled telemedicine application and verified that I am speaking with the correct person using two identifiers.  Location: Patient: home Provider: office    I discussed the limitations of evaluation and management by telemedicine and the availability of in person appointments. The patient expressed understanding and agreed to proceed.   I discussed the assessment and treatment plan with the patient. The patient was provided an opportunity to ask questions and all were answered. The patient agreed with the plan and demonstrated an understanding of the instructions.   The patient was advised to call back or seek an in-person evaluation if the symptoms worsen or if the condition fails to improve as anticipated.  I provided 16 minutes of non-face-to-face time during this encounter.   [name], NP   [facility] MD/PA/NP OP Progress Note  10/02/2024 8:08 AM Lynn Moses  MRN:  [id number]  Chief Complaint: Medication management  HPI: Lynn Moses 28 year old female presents for medication management follow-up appointment.  Continues to endorse depression and anxiety inability to focus and concentrate.  Reviewed GeneSight psychotropic test results.  Concluded with initiation of Wellbutrin 100 mg p.o. daily will follow-up 1 month for titration.  Patient reports she has a follow-up appointment with ADHD testing November 10 through 13th.   GeneSight psychometric testing reveals Wellbutrin, Pristiq, trazodone, Effexor, Viibryd, Trintellix Cymbalta and Remeron may be utilized as directed.  Will make results available via secure fax/email.   [name]  is alert/oriented x 4; calm/cooperative; and mood congruent with affect.  Second part of assessment was conducted telephonically.  patient is speaking in a clear tone at moderate volume, and normal pace; with good eye contact. Her  thought process is coherent and relevant; Patient has remained calm throughout assessment and has answered questions appropriately.     Visit Diagnosis:    ICD-10-CM   1. 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   Ovarian cyst 01/2020    Past Surgical History:  Procedure Laterality Date   NO PAST SURGERIES      Family Psychiatric History:   Family History:  Family History  Problem Relation Age of Onset   Varicose Veins Mother    Heart disease Maternal Grandmother    Hypertension Maternal Grandmother    Cancer Paternal Grandmother     Social History:  Social History   Socioeconomic History   Marital status: Media planner    Spouse name: Not on file   Number of children: 1   Years of education: 13   Highest education level: Bachelor's degree (e.g., BA, AB, BS)  Occupational History   Not on file  Tobacco Use   Smoking status: Every Day    Types: Cigarettes   Smokeless tobacco: Never  Vaping Use   Vaping status: Former  Substance and Sexual Activity   Alcohol use: Not Currently    Alcohol/week: 1.0 standard drink of alcohol    Types: 1 Glasses of wine per week    Comment: occassionally   Drug use: Never   Sexual activity: Yes  Birth control/protection: None  Other Topics Concern   Not on file  Social History Narrative   Not on file   Social Drivers of Health   Financial Resource Strain: Low Risk  (07/12/2024)   Overall Financial Resource Strain (CARDIA)    Difficulty of Paying Living Expenses: Not very hard  Food Insecurity: No Food Insecurity (07/12/2024)   Hunger Vital Sign    Worried About Running Out of Food in the Last Year: Never true    Ran Out of Food in the Last Year: Never true   Transportation Needs: No Transportation Needs (07/12/2024)   PRAPARE - Administrator, Civil Service (Medical): No    Lack of Transportation (Non-Medical): No  Physical Activity: Inactive (07/12/2024)   Exercise Vital Sign    Days of Exercise per Week: 0 days    Minutes of Exercise per Session: Not on file  Stress: Stress Concern Present (07/12/2024)   [employer] of Occupational Health - Occupational Stress Questionnaire    Feeling of Stress: Very much  Social Connections: Socially Isolated (07/12/2024)   Social Connection and Isolation Panel    Frequency of Communication with Friends and Family: Twice a week    Frequency of Social Gatherings with Friends and Family: Never    Attends Religious Services: Never    Database administrator or Organizations: No    Attends Engineer, structural: Not on file    Marital Status: Living with partner    Allergies: No Known Allergies  Metabolic Disorder Labs: No results found for: HGBA1C, MPG No results found for: PROLACTIN No results found for: CHOL, TRIG, HDL, CHOLHDL, VLDL, LDLCALC No results found for: TSH  Therapeutic Level Labs: No results found for: LITHIUM No results found for: VALPROATE No results found for: CBMZ  Current Medications: Current Outpatient Medications  Medication Sig Dispense Refill   buPROPion ER (WELLBUTRIN SR) 100 MG 12 hr tablet Take 1 tablet (100 mg total) by mouth daily. 30 tablet 0   Drospirenone  (SLYND ) 4 MG TABS Take 1 tablet (4 mg total) by mouth daily. 84 tablet 4   No current facility-administered medications for this visit.     Musculoskeletal:   Psychiatric Specialty Exam: Review of Systems  not currently breastfeeding.There is no height or weight on file to calculate BMI.  General Appearance: Casual  Eye Contact:  Good  Speech:  Clear and Coherent  Volume:  Normal  Mood:  Anxious and Depressed  Affect:  Congruent  Thought Process:   Coherent  Orientation:  Full (Time, Place, and Person)  Thought Content: Logical   Suicidal Thoughts:  No  Homicidal Thoughts:  No  Memory:  Immediate;   Good Recent;   Good  Judgement:  Good  Insight:  Good  Psychomotor Activity:  Normal  Concentration:  Concentration: Good  Recall:  Good  Fund of Knowledge: Good  Language: Good  Akathisia:  No  Handed:  Right  AIMS (if indicated): not done  Assets:  Communication Skills Desire for Improvement Social Support  ADL's:  Intact  Cognition: WNL  Sleep:  Good   Screenings: AUDIT    Flowsheet Row Office Visit from 07/16/2024 in [facility]  Alcohol Use Disorder Identification Test Final Score (AUDIT) 5    GAD-7    Flowsheet Row Office Visit from 08/14/2024 in [facility] Office Visit from 07/16/2024 in [facility] at [facility] Video Visit from 08/25/2023  in [facility] & [facility] Medicine at [facility] Office Visit from 07/14/2023 in [facility] & [facility] at [facility] Office Visit from 03/30/2023 in [facility] at [facility]  Total GAD-7 Score 16 17 16 15 15    PHQ2-9    Flowsheet Row Office Visit from 09/08/2024 in [facility] Office Visit from 08/14/20[facility] in [facility] Office Visit from 07/16/2024 in [facility] at [facility] Video Visit from 08/25/2023 in [facility] Primary Care & Sports Medicine at [facility] Office Visit from 07/14/2023 in [facility] Primary [facility] & [facility] Medicine at [facility] Total Score 6 5 5 2 2   PHQ-9 Total Score 17 15 21 7 14    Flowsheet Row UC from 01/30/2024 in [facility] Admission (Discharged) from 11/09/2022 in [place] 4S Mother Baby Unit Admission (Discharged) from 09/25/2022 in [facility]  1S Maternity Assessment Unit  C-SSRS RISK CATEGORY No Risk No Risk No Risk     Assessment and Plan:  Kenzli Barritt 28 year old female presents for medication management follow-up appointment after completing GeneSight testing.  Discussed test results amenable to use initiating Wellbutrin 100 mg p.o. daily.  Patient reports follow-up appointment for ADHD testing.  Declined initiation of nonstimulant medication at previous visit.  Ongoing symptoms related to mood instability with depression and anxiety symptoms.  Patient to follow-up 2 months for medication adherence/tolerability.  Support, encouragement and reassurance was provided.  Collaboration of Care: Collaboration of Care: Medication Management AEB initiated Wellbutrin 100 mg daily-consideration for titration  Patient/Guardian was advised Release of Information must be obtained prior to any record release in order to collaborate their care with an outside provider. Patient/Guardian was advised if they have not already done so to contact the registration department to sign all necessary forms in order for us  to release information regarding their care.   Consent: Patient/Guardian gives verbal consent for treatment and assignment of benefits for services provided during this visit. Patient/Guardian expressed understanding and agreed to proceed.    [name], NP 10/02/2024, 8:08 AM

## 2024-10-15 ENCOUNTER — Other Ambulatory Visit (HOSPITAL_COMMUNITY): Payer: Self-pay

## 2024-10-15 ENCOUNTER — Ambulatory Visit
Admission: RE | Admit: 2024-10-15 | Discharge: 2024-10-15 | Disposition: A | Discharge status: Home / Self Care | Source: Ambulatory Visit | Attending: Nurse Practitioner | Admitting: Nurse Practitioner

## 2024-10-15 VITALS — BP 128/86 | HR 89 | Temp 98.1°F | Resp 16

## 2024-10-15 DIAGNOSIS — L03011 Cellulitis of right finger: Secondary | ICD-10-CM

## 2024-10-15 MED ORDER — MUPIROCIN 2 % EX OINT
TOPICAL_OINTMENT | CUTANEOUS | 0 refills | Status: AC
Start: 2024-10-15 — End: ?
  Filled 2024-10-15: qty 22, 7d supply, fill #0

## 2024-10-15 MED ORDER — SULFAMETHOXAZOLE-TRIMETHOPRIM 800-160 MG PO TABS
1.0000 | ORAL_TABLET | Freq: Two times a day (BID) | ORAL | 0 refills | Status: AC
  Filled 2024-10-15: qty 10, 5d supply, fill #0

## 2024-10-15 NOTE — Discharge Instructions (Addendum)
 You were seen today for pain and swelling in your finger, which was diagnosed as a paronychia, or an infection around the nail. A small incision and drainage were performed to help release the trapped infection. The area was cleaned and dressed. You were prescribed Bactrim, an oral antibiotic, and mupirocin ointment to apply directly to the affected area. Take the antibiotic exactly as prescribed and complete the full course, even if your finger starts to feel better. Apply a thin layer of mupirocin ointment twice a day and keep the finger covered with a clean, dry bandage. Soak your finger in warm water with mild soap, such as Dial, for 10-15 minutes several times a day to help with healing and drainage. After soaking, gently pat the finger dry and reapply the ointment and a new bandage. Avoid squeezing or picking at the area, as this can make the infection worse. You may take acetaminophen  (Tylenol ) or ibuprofen  (Motrin , Advil ) as needed for pain and swelling. The infection should start to improve within a few days. Follow up with your primary care provider if the redness, swelling, or pain does not begin to improve within 2-3 days, or if you have recurring infections in the same area. Go to the emergency department immediately if you develop severe pain, spreading redness up your hand or arm, pus drainage that worsens, fever, chills, or difficulty moving the finger.

## 2024-10-15 NOTE — ED Provider Notes (Addendum)
 [facility]    CSN: [id number] Arrival date & time: 10/15/24  1551      History   Chief Complaint Chief Complaint  Patient presents with   Finger Injury    Index finger infected under nail bed into skin. Squeezed medium about of pus out this morning but finger is red/purple, swollen, and slightly numb. Possible exposure to lead in affected area. - Entered by patient    HPI Lynn Moses is a 28 y.o. female.   Discussed the use of AI scribe software for clinical note transcription with the patient, who gave verbal consent to proceed.   The patient presents with right index finger pain, swelling, numbness, and tingling following a hangnail injury. She reports cutting a hangnail three days ago and visiting a gun range two days ago without any initial issues. Yesterday, she began to notice numbness, tingling, and swelling of the finger, suspecting an infection but opted to wait and see if it would resolve. This morning, symptoms initially seemed improved with only mild swelling; however, while driving her son to school, she developed throbbing pain, worsening swelling, and discoloration of the finger, which appeared purple and red. In response, she cleaned an eyebrow razor and used it to lance the area along the nailbed, expressing a small amount of pus. She reports the finger looks improved compared to earlier today. She denies fever, chills, or body aches.  The following sections of the patient's history were reviewed and updated as appropriate: allergies, current medications, past family history, past medical history, past social history, past surgical history, and problem list.     Past Medical History:  Diagnosis Date   Anxiety    COVID    Depression 2020   pt has stopped taking her Zoloft when she was trying to get pregnant   Mixed anxiety and depressive disorder 05/13/2020   Ovarian cyst 01/2020    Patient Active Problem List   Diagnosis Date Noted    Anxiety 08/25/2023   Anxiety and depression 08/25/2023   History of anxiety 05/14/2022   Attention deficit disorder 03/10/2022   History of depression 06/12/2020    Past Surgical History:  Procedure Laterality Date   NO PAST SURGERIES      OB History     Gravida  2   Para  2   Term  2   Preterm      AB      Living  2      SAB      IAB      Ectopic      Multiple  0   Live Births  2            Home Medications    Prior to Admission medications   Medication Sig Start Date End Date Taking? Authorizing Provider  mupirocin ointment (BACTROBAN) 2 % Apply thin layer to affected area 2 (two) times a day for 5 days 10/15/24  Yes Kristeen Lantz, FNP  sulfamethoxazole-trimethoprim (BACTRIM DS) 800-160 MG tablet Take 1 tablet by mouth 2 (two) times daily for 5 days. 10/15/24 10/20/24 Yes Iola Lukes, FNP  buPROPion ER (WELLBUTRIN SR) 100 MG 12 hr tablet Take 1 tablet (100 mg total) by mouth daily. 10/02/24 10/02/25  Ezzard Staci SAILOR, NP  Drospirenone  (SLYND ) 4 MG TABS Take 1 tablet (4 mg total) by mouth daily. 08/14/24   Delores Nidia CROME, FNP    Family History Family History  Problem Relation Age of Onset   Varicose  Veins Mother    Heart disease Maternal Grandmother    Hypertension Maternal Grandmother    Cancer Paternal Grandmother     Social History Social History   Tobacco Use   Smoking status: Every Day    Types: Cigarettes   Smokeless tobacco: Never  Vaping Use   Vaping status: Former  Substance Use Topics   Alcohol use: Not Currently    Alcohol/week: 1.0 standard drink of alcohol    Types: 1 Glasses of wine per week    Comment: occassionally   Drug use: Never     Allergies   Patient has no known allergies.   Review of Systems Review of Systems  Constitutional:  Negative for chills and fever.  Musculoskeletal:  Positive for arthralgias.  Skin:  Positive for wound.  Neurological:  Negative for weakness and numbness.  All other  systems reviewed and are negative.    Physical Exam Triage Vital Signs ED Triage Vitals [10/15/24 1612]  Encounter Vitals Group     BP 128/86     Girls Systolic BP Percentile      Girls Diastolic BP Percentile      Boys Systolic BP Percentile      Boys Diastolic BP Percentile      Pulse Rate 89     Resp 16     Temp 98.1 F (36.7 C)     Temp Source Oral     SpO2 97 %     Weight      Height      Head Circumference      Peak Flow      Pain Score      Pain Loc      Pain Education      Exclude from Growth Chart    No data found.  Updated Vital Signs BP 128/86 (BP Location: Right Arm)   Pulse 89   Temp 98.1 F (36.7 C) (Oral)   Resp 16   LMP 10/09/2024 (Exact Date)   SpO2 97%   Breastfeeding No   Visual Acuity Right Eye Distance:   Left Eye Distance:   Bilateral Distance:    Right Eye Near:   Left Eye Near:    Bilateral Near:     Physical Exam Vitals reviewed.  Constitutional:      General: She is awake. She is not in acute distress.    Appearance: Normal appearance. She is well-developed. She is not ill-appearing, toxic-appearing or diaphoretic.  HENT:     Head: Normocephalic.     Right Ear: Hearing normal.     Left Ear: Hearing normal.     Nose: Nose normal.     Mouth/Throat:     Mouth: Mucous membranes are moist.  Eyes:     General: Vision grossly intact.     Conjunctiva/sclera: Conjunctivae normal.  Cardiovascular:     Rate and Rhythm: Normal rate and regular rhythm.     Heart sounds: Normal heart sounds.  Pulmonary:     Effort: Pulmonary effort is normal.     Breath sounds: Normal breath sounds and air entry.  Musculoskeletal:        General: Normal range of motion.       Hands:     Cervical back: Normal range of motion and neck supple.  Skin:    General: Skin is warm and dry.     Findings: Abscess present.     Comments: Erythema, swelling, and tenderness noted to the distal aspect of the right index  finger. Localized abscess present along  the nailbed. No streaking erythema or fluctuance extending proximally. Full range of motion maintained with intact strength, sensation, and capillary refill.     Neurological:     General: No focal deficit present.     Mental Status: She is alert and oriented to person, place, and time.  Psychiatric:        Speech: Speech normal.        Behavior: Behavior is cooperative.      UC Treatments / Results  Labs (all labs ordered are listed, but only abnormal results are displayed) Labs Reviewed - No data to display  EKG   Radiology No results found.  Procedures Incision and Drainage  Date/Time: 10/15/2024 4:39 PM  Performed by: Iola Lukes, FNP Authorized by: Iola Lukes, FNP   Consent:    Consent obtained:  Verbal   Consent given by:  Patient   Risks, benefits, and alternatives were discussed: yes     Risks discussed:  Bleeding, incomplete drainage, pain and infection Universal protocol:    Patient identity confirmed:  Verbally with patient Location:    Type:  Abscess (paronychia)   Location:  Upper extremity   Upper extremity location:  Finger   Finger location:  R index finger Pre-procedure details:    Skin preparation:  Povidone-iodine Sedation:    Sedation type:  None Anesthesia:    Anesthesia method:  None Procedure type:    Complexity:  Simple Procedure details:    Incision types:  Single with marsupialization   Drainage:  Purulent   Drainage amount:  Scant   Wound treatment:  Wound left open (dressed with sterile bandage) Post-procedure details:    Procedure completion:  Tolerated well, no immediate complications  (including critical care time)  Medications Ordered in UC Medications - No data to display  Initial Impression / Assessment and Plan / UC Course  I have reviewed the triage vital signs and the nursing notes.  Pertinent labs & imaging results that were available during my care of the patient were reviewed by me and considered  in my medical decision making (see chart for details).     Patient presents with finger pain and swelling consistent with acute paronychia. No fever or systemic symptoms noted. Incision and drainage performed in clinic with successful evacuation of purulent material. Wound cleaned and dressed. Patient prescribed Bactrim DS for infection coverage and mupirocin ointment for topical application. Advised to perform warm water soaks several times daily and keep the area clean and dry. Supportive care and wound care instructions reviewed. Patient advised to monitor for worsening redness, swelling, pain, or fever and to follow up if symptoms do not improve within a few days or if condition worsens.  Today's evaluation has revealed no signs of a dangerous process. Discussed diagnosis with patient and/or guardian. Patient and/or guardian aware of their diagnosis, possible red flag symptoms to watch out for and need for close follow up. Patient and/or guardian understands verbal and written discharge instructions. Patient and/or guardian comfortable with plan and disposition.  Patient and/or guardian has a clear mental status at this time, good insight into illness (after discussion and teaching) and has clear judgment to make decisions regarding their care  Documentation was completed with the aid of voice recognition software. Transcription may contain typographical errors.  Final Clinical Impressions(s) / UC Diagnoses   Final diagnoses:  Paronychia of right index finger     Discharge Instructions      You were seen  today for pain and swelling in your finger, which was diagnosed as a paronychia, or an infection around the nail. A small incision and drainage were performed to help release the trapped infection. The area was cleaned and dressed. You were prescribed Bactrim, an oral antibiotic, and mupirocin ointment to apply directly to the affected area. Take the antibiotic exactly as prescribed and  complete the full course, even if your finger starts to feel better. Apply a thin layer of mupirocin ointment twice a day and keep the finger covered with a clean, dry bandage. Soak your finger in warm water with mild soap, such as Dial, for 10-15 minutes several times a day to help with healing and drainage. After soaking, gently pat the finger dry and reapply the ointment and a new bandage. Avoid squeezing or picking at the area, as this can make the infection worse. You may take acetaminophen  (Tylenol ) or ibuprofen  (Motrin , Advil ) as needed for pain and swelling. The infection should start to improve within a few days. Follow up with your primary care provider if the redness, swelling, or pain does not begin to improve within 2-3 days, or if you have recurring infections in the same area. Go to the emergency department immediately if you develop severe pain, spreading redness up your hand or arm, pus drainage that worsens, fever, chills, or difficulty moving the finger.     ED Prescriptions     Medication Sig Dispense Auth. Provider   mupirocin ointment (BACTROBAN) 2 % Apply thin layer to affected area 2 (two) times a day for 5 days 22 g Iola Lukes, FNP   sulfamethoxazole-trimethoprim (BACTRIM DS) 800-160 MG tablet Take 1 tablet by mouth 2 (two) times daily for 5 days. 10 tablet Iola Lukes, FNP      PDMP not reviewed this encounter.   Iola Lukes, FNP 10/15/24 1639    Iola Lukes, FNP 10/15/24 [phone]

## 2024-10-15 NOTE — ED Triage Notes (Signed)
 Pt states on Sunday her right pointer finger began to feel a little sore. This morning she had redness, pain, and swelling. Pt poked with needle and drained pus today but site is swelling back up.

## 2024-10-24 DIAGNOSIS — F411 Generalized anxiety disorder: Secondary | ICD-10-CM | POA: Diagnosis not present

## 2024-10-30 ENCOUNTER — Other Ambulatory Visit (HOSPITAL_COMMUNITY): Payer: Self-pay

## 2024-10-30 ENCOUNTER — Encounter (HOSPITAL_COMMUNITY): Payer: Self-pay

## 2024-10-30 ENCOUNTER — Telehealth (HOSPITAL_COMMUNITY): Admitting: Family

## 2024-10-30 DIAGNOSIS — F419 Anxiety disorder, unspecified: Secondary | ICD-10-CM

## 2024-10-30 DIAGNOSIS — R41840 Attention and concentration deficit: Secondary | ICD-10-CM

## 2024-10-30 DIAGNOSIS — F32A Depression, unspecified: Secondary | ICD-10-CM

## 2024-10-30 MED ORDER — BUSPIRONE HCL 5 MG PO TABS
5.0000 mg | ORAL_TABLET | Freq: Two times a day (BID) | ORAL | 1 refills | Status: AC
  Filled 2024-10-30: qty 60, 30d supply, fill #0
  Filled 2024-12-25: qty 60, 30d supply, fill #1

## 2024-10-30 MED ORDER — BUPROPION HCL ER (XL) 150 MG PO TB24
150.0000 mg | ORAL_TABLET | ORAL | 2 refills | Status: AC
  Filled 2024-10-30: qty 30, 30d supply, fill #0

## 2024-10-30 NOTE — Progress Notes (Signed)
 Virtual Visit via Video Note  I connected with Lynn Moses on 10/30/24 at  7:00 AM EST by a video enabled telemedicine application and verified that I am speaking with the correct person using two identifiers.  Location: Patient: Home Provider: Office   I discussed the limitations of evaluation and management by telemedicine and the availability of in person appointments. The patient expressed understanding and agreed to proceed.  I discussed the assessment and treatment plan with the patient. The patient was provided an opportunity to ask questions and all were answered. The patient agreed with the plan and demonstrated an understanding of the instructions.   The patient was advised to call back or seek an in-person evaluation if the symptoms worsen or if the condition fails to improve as anticipated.  I provided 20 minutes of non-face-to-face time during this encounter.   [name], NP    BH MD/PA/NP OP Progress Note  10/30/2024 9:22 AM [name]  MRN:  [id number]  Chief Complaint: I think by Wellbutrin needs to be increased  HPI: Lynn Moses 28 year old female who presents for medication management follow-up appointment.  She was initiated on Wellbutrin 100 mg daily at previous appointment.  She reports ongoing symptoms related to depression and anxiety.  Reports it is more my anxiety but I am having issues with depression too.  Per previous assessment patient most pressing concern was related to concentration and focus.  She reports having a follow-up appointment next week for adult ADHD testing.  Consideration for increasing Wellbutrin (NDRI) to 150 mg extended release however, education provided with possibility of heightened anxiety with Wellbutrin.   Nikitia inquired about Gene[facility] test results as she states she has not received a physical copy.  Discussed initiating Pristiq (SNRI)to help with anxiety and depression reported symptoms.  [name]  reports she will review medications and make determination of medication route.   This provider spoke to CMA who made results available through [facility] website recent late for test results.  Will follow-up with medication consideration.  Contact patient telephonically at 10 AM to discuss additional medication options.  To include propranolol and/or BuSpar.  Patient was amendable to initiating BuSpar 5 mg p.o. twice daily and increase Wellbutrin 150 mg.  Patient was receptive to plan.  Support encouragement reassurance was provided.   Visit Diagnosis:    ICD-10-CM   1. Anxiety and depression  F41.9    F32.A     2. Poor concentration  R41.840       Past Psychiatric History: stated she was previously followed by therapy and psychiatry in the past,and is currently followed by [facility] [name] 4 years, to which she sees her therapist weekly. Stated that she has taken Lexapro , Zoloft, Wellbutrin in the past. States she felt that the medication stopped working.   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   Ovarian cyst 01/2020    Past Surgical History:  Procedure Laterality Date   NO PAST SURGERIES      Family Psychiatric History:   Family History:  Family History  Problem Relation Age of Onset   Varicose Veins Mother    Heart disease Maternal Grandmother    Hypertension Maternal Grandmother    Cancer Paternal Grandmother     Social  History:  Social History   Socioeconomic History   Marital status: Media Planner    Spouse name: Not on file   Number of children: 1   Years of education: 13   Highest education level: Bachelor's degree (e.g., BA, AB, BS)  Occupational History   Not on file  Tobacco Use   Smoking status: Every Day    Types: Cigarettes   Smokeless tobacco:  Never  Vaping Use   Vaping status: Former  Substance and Sexual Activity   Alcohol use: Not Currently    Alcohol/week: 1.0 standard drink of alcohol    Types: 1 Glasses of wine per week    Comment: occassionally   Drug use: Never   Sexual activity: Yes    Birth control/protection: None  Other Topics Concern   Not on file  Social History Narrative   Not on file   Social Drivers of Health   Financial Resource Strain: Low Risk  (07/12/2024)   Overall Financial Resource Strain (CARDIA)    Difficulty of Paying Living Expenses: Not very hard  Food Insecurity: No Food Insecurity (07/12/2024)   Hunger Vital Sign    Worried About Running Out of Food in the Last Year: Never true    Ran Out of Food in the Last Year: Never true  Transportation Needs: No Transportation Needs (07/12/2024)   PRAPARE - Administrator, Civil Service (Medical): No    Lack of Transportation (Non-Medical): No  Physical Activity: Inactive (07/12/2024)   Exercise Vital Sign    Days of Exercise per Week: 0 days    Minutes of Exercise per Session: Not on file  Stress: Stress Concern Present (07/12/2024)   [employer] of Occupational Health - Occupational Stress Questionnaire    Feeling of Stress: Very much  Social Connections: Socially Isolated (07/12/2024)   Social Connection and Isolation Panel    Frequency of Communication with Friends and Family: Twice a week    Frequency of Social Gatherings with Friends and Family: Never    Attends Religious Services: Never    Database Administrator or Organizations: No    Attends Engineer, Structural: Not on file    Marital Status: Living with partner    Allergies: No Known Allergies  Metabolic Disorder Labs: No results found for: HGBA1C, MPG No results found for: PROLACTIN No results found for: CHOL, TRIG, HDL, CHOLHDL, VLDL, LDLCALC No results found for: TSH  Therapeutic Level Labs: No results found for: LITHIUM No  results found for: VALPROATE No results found for: CBMZ  Current Medications: Current Outpatient Medications  Medication Sig Dispense Refill   buPROPion (WELLBUTRIN XL) 150 MG 24 hr tablet Take 1 tablet (150 mg total) by mouth every morning. 30 tablet 2   buPROPion ER (WELLBUTRIN SR) 100 MG 12 hr tablet Take 1 tablet (100 mg total) by mouth daily. 30 tablet 0   Drospirenone  (SLYND ) 4 MG TABS Take 1 tablet (4 mg total) by mouth daily. 84 tablet 4   mupirocin ointment (BACTROBAN) 2 % Apply thin layer to affected area 2 (two) times a day for 5 days 22 g 0   No current facility-administered medications for this visit.     Musculoskeletal: Virtual assessment  Psychiatric Specialty Exam: Review of Systems  Last menstrual period 10/09/2024, not currently breastfeeding.There is no height or weight on file to calculate BMI.  General Appearance: Casual  Eye Contact:  Good  Speech:  Clear and Coherent  Volume:  Normal  Mood:  Anxious and Depressed  Affect:  Congruent  Thought Process:  Coherent  Orientation:  Full (Time, Place, and Person)  Thought Content: Logical   Suicidal Thoughts:  No  Homicidal Thoughts:  No  Memory:  Immediate;   Good  Judgement:  Fair  Insight:  Fair  Psychomotor Activity:  Normal  Concentration:  Concentration: Good  Recall:  Fair  Fund of Knowledge: Good  Language: Fair  Akathisia:  No  Handed:  Right  AIMS (if indicated): not done  Assets:  Communication Skills Desire for Improvement  ADL's:  Intact  Cognition: WNL  Sleep:  Fair   Screenings: AUDIT    Flowsheet Row Office Visit from 07/16/2024 in [facility]  Alcohol Use Disorder Identification Test Final Score (AUDIT) 5    GAD-7    Flowsheet Row Office Visit from 08/14/2024 in [facility] Office Visit from 07/16/2024 in [facility] Video Visit from 08/25/2023 in [facility] &  [facility] at [facility] Office Visit from 07/14/2023 in [facility] & [facility] Medicine at [facility] Office Visit from 03/30/2023 in [facility] at [facility]  Total GAD-7 Score 16 17 16 15 15    PHQ2-9    Flowsheet Row [facility]/08/2024 [facility] Office Visit from 08/14/2024 in [facility] Office Visit from 07/16/2024 in [facility] Video Visit from 08/25/2023 in [facility] & Sports Medicine at [facility] Office Visit from 07/14/2023 in [facility] Primary Care & Sports Medicine at [facility] Total Score 6 5 5 2 2   PHQ-9 Total Score 17 15 21 7 14    Flowsheet Row UC from 10/15/2024 in [facility] UC from 01/30/2024 in [facility] Admission (Discharged) from 11/09/2022 in [facility]  C-SSRS RISK CATEGORY No Risk No Risk No Risk     Assessment and Plan: Kc Sedlak 28 year old female presents for medication management follow-up appointment.  Was recently initiated on Wellbutrin 100 mg daily as she initially reports her main symptoms is related to concentration and focus.  Encouraged patient to follow-up with adult ADHD testing which she reports she has a follow-up appointment next week.  States her anxiety/depression has intensified during this assessment.  Initially requested to increase Wellbutrin, education was provided with possibility of medication heightening anxiety.  Offered to initiate Pristiq.  Education provided with expectation of benefit from recent initiation of medication, coupled with overlapping symptoms related to anxiety, depression and concentration issues will need to be addressed.    Major depressive disorder: Generalized anxiety disorder: Poor  concentration and focus:  Increase Wellbutrin 100 mg to Wellbutrin 150 mg XL Discussed initiating propranolol and/or BuSpar to help with reported anxiety symptoms.  She was amendable to initiating BuSpar and increasing Wellbutrin.   Outpatient follow-up appointment with adult ADHD testing. Patient to follow-up 3 months for medication adherence/tolerability.  Collaboration of Care: Collaboration of Care: Medication Management AEB patient reports she will follow-up after review of GeneSight test results  Patient/Guardian was advised Release of Information must be obtained prior to any record release in order to collaborate their care with an outside provider. Patient/Guardian was advised if they have not already done so to contact the registration department  to sign all necessary forms in order for us  to release information regarding their care.   Consent: Patient/Guardian gives verbal consent for treatment and assignment of benefits for services provided during this visit. Patient/Guardian expressed understanding and agreed to proceed.    [name], NP 10/30/2024, 9:22 AM

## 2024-11-05 DIAGNOSIS — R41840 Attention and concentration deficit: Secondary | ICD-10-CM | POA: Diagnosis not present

## 2024-11-08 DIAGNOSIS — F338 Other recurrent depressive disorders: Secondary | ICD-10-CM | POA: Diagnosis not present

## 2024-11-08 DIAGNOSIS — R41840 Attention and concentration deficit: Secondary | ICD-10-CM | POA: Diagnosis not present

## 2024-11-08 DIAGNOSIS — F419 Anxiety disorder, unspecified: Secondary | ICD-10-CM | POA: Diagnosis not present

## 2024-12-25 ENCOUNTER — Other Ambulatory Visit (HOSPITAL_COMMUNITY): Payer: Self-pay

## 2024-12-29 ENCOUNTER — Other Ambulatory Visit (HOSPITAL_COMMUNITY): Payer: Self-pay
